# Patient Record
Sex: Male | Born: 1985
Health system: Southern US, Community
[De-identification: ages and names within clinical notes are randomized; demographics above are authoritative.]

## PROBLEM LIST (undated history)

## (undated) DIAGNOSIS — Z87442 Personal history of urinary calculi: Secondary | ICD-10-CM

## (undated) DIAGNOSIS — M1990 Unspecified osteoarthritis, unspecified site: Secondary | ICD-10-CM

## (undated) DIAGNOSIS — M84371S Stress fracture, right ankle, sequela: Secondary | ICD-10-CM

## (undated) DIAGNOSIS — S63091S Other subluxation of right wrist and hand, sequela: Secondary | ICD-10-CM

## (undated) DIAGNOSIS — C649 Malignant neoplasm of unspecified kidney, except renal pelvis: Secondary | ICD-10-CM

## (undated) DIAGNOSIS — I10 Essential (primary) hypertension: Secondary | ICD-10-CM

## (undated) DIAGNOSIS — T7840XA Allergy, unspecified, initial encounter: Secondary | ICD-10-CM

## (undated) HISTORY — DX: Allergy, unspecified, initial encounter: T78.40XA

## (undated) HISTORY — DX: Other subluxation of right wrist and hand, sequela: S63.091S

## (undated) HISTORY — DX: Stress fracture, right ankle, sequela: M84.371S

## (undated) HISTORY — DX: Essential (primary) hypertension: I10

## (undated) HISTORY — PX: WISDOM TOOTH EXTRACTION: SHX21

## (undated) HISTORY — DX: Malignant neoplasm of unspecified kidney, except renal pelvis: C64.9

## (undated) HISTORY — DX: Unspecified osteoarthritis, unspecified site: M19.90

---
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
## 2016-12-11 ENCOUNTER — Ambulatory Visit (INDEPENDENT_AMBULATORY_CARE_PROVIDER_SITE_OTHER): Payer: Commercial Managed Care - PPO | Admitting: Orthopaedic Surgery

## 2016-12-11 ENCOUNTER — Encounter (INDEPENDENT_AMBULATORY_CARE_PROVIDER_SITE_OTHER): Payer: Self-pay | Admitting: Orthopaedic Surgery

## 2016-12-11 ENCOUNTER — Ambulatory Visit (INDEPENDENT_AMBULATORY_CARE_PROVIDER_SITE_OTHER): Payer: Commercial Managed Care - PPO

## 2016-12-11 DIAGNOSIS — S63094A Other dislocation of right wrist and hand, initial encounter: Secondary | ICD-10-CM

## 2016-12-11 DIAGNOSIS — IMO0001 Reserved for inherently not codable concepts without codable children: Secondary | ICD-10-CM

## 2016-12-11 NOTE — Progress Notes (Signed)
   Office Visit Note   Patient: Richard [name]           Date of Birth: [birth year]           MRN: [id number] Visit Date: 12/11/2016              Requested by: No referring provider defined for this encounter. PCP: No PCP Per Patient   Assessment & Plan: Visit Diagnoses:  1. ECU (extensor carpi ulnaris), subluxation/dislocation, right, initial encounter     Plan: At this point he has had significant conservative treatment I think he should try to resume tennis with a support brace or taping to his sister who is an Product/process development scientist will provide for him. He can start to relax his splinting at night as long as his symptoms don't worsen with playing tennis. Questions encouraged and answered. I will see him back as needed.  Follow-Up Instructions: Return if symptoms worsen or fail to improve.   Orders:  Orders Placed This Encounter  Procedures  . XR Wrist Complete Right   No orders of the defined types were placed in this encounter.     Procedures: No procedures performed   Clinical Data: No additional findings.   Subjective: Chief Complaint  Patient presents with  . Right Wrist - Pain    Patient is a 31 year old right-hand dominant gentleman who has had a subluxing ECU tendon with pain since October 31. He has been seen and treated conservatively in [place] but she is to follow-up here today. 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 He has worn a custom splint which keeps his elbow in 90 of flexion and wrist pronation at night for about 2 months. This has significantly helped. He takes over-the-counter medicines as needed. His pain mainly is recreated with ulnar deviation of the wrist. He does not have any pain at work.    Review of Systems Complete review of systems is negative except for history of present illness  Objective: Vital Signs: There were no vitals taken for this  visit.  Physical Exam Well-developed well-nourished no acute distress alert 3 however breathing normal judgments affect abdomen soft no lymphadenopathy Ortho Exam Exam of the right wrist shows a subluxing ECU tendon with ulnar deviation of the wrist. This does not seem to cause him significant pain. There is no crepitus with palpation. Specialty Comments:  No specialty comments available.  Imaging: Xr Wrist Complete Right  Result Date: 12/11/2016 No acute bony abnormalities    PMFS History: Patient Active Problem List   Diagnosis Date Noted  . ECU (extensor carpi ulnaris), subluxation/dislocation, right, initial encounter 12/11/2016   No past medical history on file.  No family history on file.  No past surgical history on file. Social History   Occupational History  . Not on file.   Social History Main Topics  . Smoking status: Never Smoker  . Smokeless tobacco: Never Used  . Alcohol use Not on file  . Drug use: Unknown  . Sexual activity: Not on file

## 2018-04-18 ENCOUNTER — Ambulatory Visit (INDEPENDENT_AMBULATORY_CARE_PROVIDER_SITE_OTHER): Payer: Self-pay | Admitting: Nurse Practitioner

## 2018-04-18 VITALS — BP 140/75 | HR 74 | Temp 98.5°F | Resp 16 | Wt 230.2 lb

## 2018-04-18 DIAGNOSIS — J069 Acute upper respiratory infection, unspecified: Secondary | ICD-10-CM

## 2018-04-18 MED ORDER — AZITHROMYCIN 250 MG PO TABS: ORAL_TABLET | ORAL | 0 refills | Status: AC

## 2018-04-18 MED ORDER — ALBUTEROL SULFATE HFA 108 (90 BASE) MCG/ACT IN AERS: 2.0000 | INHALATION_SPRAY | Freq: Four times a day (QID) | RESPIRATORY_TRACT | 0 refills | Status: DC | PRN

## 2018-04-18 MED ORDER — AZITHROMYCIN 250 MG PO TABS: ORAL_TABLET | ORAL | 0 refills | Status: DC

## 2018-04-18 MED ORDER — PSEUDOEPH-BROMPHEN-DM 30-2-10 MG/5ML PO SYRP: 5.0000 mL | ORAL_SOLUTION | Freq: Four times a day (QID) | ORAL | 0 refills | Status: AC | PRN

## 2018-04-18 MED ORDER — MONTELUKAST SODIUM 10 MG PO TABS: 10.0000 mg | ORAL_TABLET | Freq: Every day | ORAL | 1 refills | Status: DC

## 2018-04-18 MED ORDER — PSEUDOEPH-BROMPHEN-DM 30-2-10 MG/5ML PO SYRP: 5.0000 mL | ORAL_SOLUTION | Freq: Four times a day (QID) | ORAL | 0 refills | Status: DC | PRN

## 2018-04-18 MED FILL — AZITHROMYCIN 250 MG TABS: 250 | 5 days supply | Qty: 6 | Fill #0

## 2018-04-18 MED FILL — MONTELUKAST SOD 10 MG TAB: 10 | 30 days supply | Qty: 30 | Fill #0

## 2018-04-18 MED FILL — VENTOLIN HFA 90 MCG INHALER: 108 (90 BAS | 25 days supply | Qty: 18 | Fill #0

## 2018-04-18 MED FILL — BROMIPHENIR-PSEUDOEPHED-DM: 30-2-10 | 4 days supply | Qty: 90 | Fill #0

## 2018-04-18 NOTE — Progress Notes (Signed)
Subjective:  Richard [name] is a 32 y.o. male who presents for evaluation of URI like symptoms.  Symptoms include bilateral ear pressure/pain, cough described as nonproductive, chills, fever: fevers up to 100.1 degrees, nasal congestion, post nasal drip, sinus pressure and sore throat.  Onset of symptoms was 4 days ago, and has been gradually worsening since that time.  Treatment to date:  antihistamines and ibuprofen.  High risk factors for influenza complications:  none.  The following portions of the patient's history were reviewed and updated as appropriate:  allergies, current medications and past medical history.  Constitutional: positive for chills, fatigue, fevers and sweats, negative for anorexia and night sweats Eyes: negative Ears, nose, mouth, throat, and face: positive for nasal congestion, sore throat and bilateral ear fullness/pressure, negative for ear drainage, earaches and hoarseness Respiratory: positive for cough and wheezing, negative for asthma, chronic bronchitis, dyspnea on exertion, sputum and stridor Cardiovascular: negative Gastrointestinal: negative Neurological: negative Allergic/Immunologic: positive for hay fever Objective:  BP 140/75 (BP Location: Right Arm, Patient Position: Sitting, Cuff Size: Normal)   Pulse 74   Temp 98.5 F (36.9 C) (Oral)   Resp 16   Wt 230 lb 3.2 oz (104.4 kg)   SpO2 97%  General appearance: alert, cooperative, fatigued and mildly diaphoretic Head: Normocephalic, without obvious abnormality, atraumatic Eyes: conjunctivae/corneas clear. PERRL, EOM's intact. Fundi benign. Ears: normal TM's and external ear canals both ears Nose: no discharge, turbinates swollen, inflamed, no sinus tenderness Throat: lips, mucosa, and tongue normal; teeth and gums normal Lungs: clear to auscultation bilaterally Heart: regular rate and rhythm, S1, S2 normal, no murmur, click, rub or gallop Abdomen: soft, non-tender; bowel sounds normal; no masses,  no  organomegaly Pulses: 2+ and symmetric Skin: Skin color, texture, turgor normal. No rashes or lesions Lymph nodes: cervical and submandibular nodes normal Neurologic: Grossly normal    Assessment:  Acute Upper Respiratory Infection and Cough    Plan:  Discussed diagnosis and treatment of URI. Educational material distributed and questions answered. Suggested symptomatic OTC remedies. Supportive care with appropriate antipyretics and fluids. Nasal saline spray for congestion. Zithromax, Bromfed, Albuterol (if needed), Singulair per orders. Nasal steroids per orders. Follow up as needed.  Meds ordered this encounter  Medications  . DISCONTD: azithromycin (ZITHROMAX) 250 MG tablet    Sig: Take as directed.    Dispense:  6 tablet    Refill:  0    Order Specific Question:   Supervising Provider    Answer:   Ricard Dillon [[id number]]  . DISCONTD: brompheniramine-pseudoephedrine-DM 30-2-10 MG/5ML syrup    Sig: Take 5 mLs by mouth 4 (four) times daily as needed for up to 7 days.    Dispense:  150 mL    Refill:  0    Order Specific Question:   Supervising Provider    Answer:   Ricard Dillon [[id number]]  . albuterol (PROVENTIL HFA;VENTOLIN HFA) 108 (90 Base) MCG/ACT inhaler    Sig: Inhale 2 puffs into the lungs every 6 (six) hours as needed for up to 10 days for wheezing or shortness of breath.    Dispense:  1 Inhaler    Refill:  0    Order Specific Question:   Supervising Provider    Answer:   Ricard Dillon [[id number]]  . DISCONTD: montelukast (SINGULAIR) 10 MG tablet    Sig: Take 1 tablet (10 mg total) by mouth at bedtime.    Dispense:  30 tablet    Refill:  1  Order Specific Question:   Supervising Provider    Answer:   Ricard Dillon [[id number]]  . azithromycin (ZITHROMAX) 250 MG tablet    Sig: Take as directed.    Dispense:  6 tablet    Refill:  0    Order Specific Question:   Supervising Provider    Answer:   Ricard Dillon [[id number]]  . brompheniramine-pseudoephedrine-DM 30-2-10 MG/5ML  syrup    Sig: Take 5 mLs by mouth 4 (four) times daily as needed for up to 7 days.    Dispense:  150 mL    Refill:  0    Order Specific Question:   Supervising Provider    Answer:   Ricard Dillon [[id number]]  . montelukast (SINGULAIR) 10 MG tablet    Sig: Take 1 tablet (10 mg total) by mouth at bedtime.    Dispense:  30 tablet    Refill:  1    Order Specific Question:   Supervising Provider    Answer:   Ricard Dillon [phone]

## 2018-04-18 NOTE — Patient Instructions (Signed)
Upper Respiratory Infection, Adult Most upper respiratory infections (URIs) are a viral infection of the air passages leading to the lungs. A URI affects the nose, throat, and upper air passages. The most common type of URI is nasopharyngitis and is typically referred to as "the common cold." URIs run their course and usually go away on their own. Most of the time, a URI does not require medical attention, but sometimes a bacterial infection in the upper airways can follow a viral infection. This is called a secondary infection. Sinus and middle ear infections are common types of secondary upper respiratory infections. Bacterial pneumonia can also complicate a URI. A URI can worsen asthma and chronic obstructive pulmonary disease (COPD). Sometimes, these complications can require emergency medical care and may be life threatening. What are the causes? Almost all URIs are caused by viruses. A virus is a type of germ and can spread from one person to another. What increases the risk? You may be at risk for a URI if:  You smoke.  You have chronic heart or lung disease.  You have a weakened defense (immune) system.  You are very young or very old.  You have nasal allergies or asthma.  You work in crowded or poorly ventilated areas.  You work in health care facilities or schools.  What are the signs or symptoms? Symptoms typically develop 2-3 days after you come in contact with a cold virus. Most viral URIs last 7-10 days. However, viral URIs from the influenza virus (flu virus) can last 14-18 days and are typically more severe. Symptoms may include:  Runny or stuffy (congested) nose.  Sneezing.  Cough.  Sore throat.  Headache.  Fatigue.  Fever.  Loss of appetite.  Pain in your forehead, behind your eyes, and over your cheekbones (sinus pain).  Muscle aches.  How is this diagnosed? Your health care provider may diagnose a URI by:  Physical exam.  Tests to check that your  symptoms are not due to another condition such as: ? Strep throat. ? Sinusitis. ? Pneumonia. ? Asthma.  How is this treated? A URI goes away on its own with time. It cannot be cured with medicines, but medicines may be prescribed or recommended to relieve symptoms. Medicines may help:  Reduce your fever.  Reduce your cough.  Relieve nasal congestion.  Follow these instructions at home:  Take medicines only as directed by your health care provider.  Gargle warm saltwater or take cough drops to comfort your throat as directed by your health care provider.  Use a warm mist humidifier or inhale steam from a shower to increase air moisture. This may make it easier to breathe.  Drink enough fluid to keep your urine clear or pale yellow.  Eat soups and other clear broths and maintain good nutrition.  Rest as needed.  Return to work when your temperature has returned to normal or as your health care provider advises. You may need to stay home longer to avoid infecting others. You can also use a face mask and careful hand washing to prevent spread of the virus.  Increase the usage of your inhaler if you have asthma.  Do not use any tobacco products, including cigarettes, chewing tobacco, or electronic cigarettes. If you need help quitting, ask your health care provider. How is this prevented? The best way to protect yourself from getting a cold is to practice good hygiene.  Avoid oral or hand contact with people with cold symptoms.  Wash your   hands often if contact occurs.  There is no clear evidence that vitamin C, vitamin E, echinacea, or exercise reduces the chance of developing a cold. However, it is always recommended to get plenty of rest, exercise, and practice good nutrition. Contact a health care provider if:  You are getting worse rather than better.  Your symptoms are not controlled by medicine.  You have chills.  You have worsening shortness of breath.  You have  brown or red mucus.  You have yellow or brown nasal discharge.  You have pain in your face, especially when you bend forward.  You have a fever.  You have swollen neck glands.  You have pain while swallowing.  You have white areas in the back of your throat. Get help right away if:  You have severe or persistent: ? Headache. ? Ear pain. ? Sinus pain. ? Chest pain.  You have chronic lung disease and any of the following: ? Wheezing. ? Prolonged cough. ? Coughing up blood. ? A change in your usual mucus.  You have a stiff neck.  You have changes in your: ? Vision. ? Hearing. ? Thinking. ? Mood. This information is not intended to replace advice given to you by your health care provider. Make sure you discuss any questions you have with your health care provider. Document Released: 05/19/2001 Document Revised: 07/26/2016 Document Reviewed: 02/28/2014 Elsevier Interactive Patient Education  2018 [employer]  Cough, Adult Coughing is a reflex that clears your throat and your airways. Coughing helps to heal and protect your lungs. It is normal to cough occasionally, but a cough that happens with other symptoms or lasts a long time may be a sign of a condition that needs treatment. A cough may last only 2-3 weeks (acute), or it may last longer than 8 weeks (chronic). What are the causes? Coughing is commonly caused by:  Breathing in substances that irritate your lungs.  A viral or bacterial respiratory infection.  Allergies.  Asthma.  Postnasal drip.  Smoking.  Acid backing up from the stomach into the esophagus (gastroesophageal reflux).  Certain medicines.  Chronic lung problems, including COPD (or rarely, lung cancer).  Other medical conditions such as heart failure.  Follow these instructions at home: Pay attention to any changes in your symptoms. Take these actions to help with your discomfort:  Take medicines only as told by your health care  provider. ? If you were prescribed an antibiotic medicine, take it as told by your health care provider. Do not stop taking the antibiotic even if you start to feel better. ? Talk with your health care provider before you take a cough suppressant medicine.  Drink enough fluid to keep your urine clear or pale yellow.  If the air is dry, use a cold steam vaporizer or humidifier in your bedroom or your home to help loosen secretions.  Avoid anything that causes you to cough at work or at home.  If your cough is worse at night, try sleeping in a semi-upright position.  Avoid cigarette smoke. If you smoke, quit smoking. If you need help quitting, ask your health care provider.  Avoid caffeine.  Avoid alcohol.  Rest as needed.  Contact a health care provider if:  You have new symptoms.  You cough up pus.  Your cough does not get better after 2-3 weeks, or your cough gets worse.  You cannot control your cough with suppressant medicines and you are losing sleep.  You develop pain that is   getting worse or pain that is not controlled with pain medicines.  You have a fever.  You have unexplained weight loss.  You have night sweats. Get help right away if:  You cough up blood.  You have difficulty breathing.  Your heartbeat is very fast. This information is not intended to replace advice given to you by your health care provider. Make sure you discuss any questions you have with your health care provider. Document Released: 05/22/2011 Document Revised: 04/30/2016 Document Reviewed: 01/30/2015 Elsevier Interactive Patient Education  2018 [employer] Inc.  

## 2018-04-20 ENCOUNTER — Telehealth: Payer: Self-pay

## 2018-04-20 NOTE — Telephone Encounter (Signed)
Patient states he is doing better and he is following the treatment prescribed by the provider.

## 2018-06-24 DIAGNOSIS — H5203 Hypermetropia, bilateral: Secondary | ICD-10-CM | POA: Diagnosis not present

## 2018-06-24 DIAGNOSIS — H52223 Regular astigmatism, bilateral: Secondary | ICD-10-CM | POA: Diagnosis not present

## 2018-09-07 ENCOUNTER — Ambulatory Visit: Payer: Self-pay | Admitting: Family Medicine

## 2018-09-15 ENCOUNTER — Encounter: Payer: Self-pay | Admitting: Family Medicine

## 2018-09-15 ENCOUNTER — Ambulatory Visit: Payer: 59 | Admitting: Family Medicine

## 2018-09-15 VITALS — BP 130/60 | HR 66 | Temp 97.8°F | Ht 70.5 in | Wt 236.2 lb

## 2018-09-15 DIAGNOSIS — D229 Melanocytic nevi, unspecified: Secondary | ICD-10-CM | POA: Diagnosis not present

## 2018-09-15 DIAGNOSIS — Z23 Encounter for immunization: Secondary | ICD-10-CM

## 2018-09-15 DIAGNOSIS — Z0000 Encounter for general adult medical examination without abnormal findings: Secondary | ICD-10-CM | POA: Diagnosis not present

## 2018-09-15 DIAGNOSIS — Z13220 Encounter for screening for lipoid disorders: Secondary | ICD-10-CM

## 2018-09-15 DIAGNOSIS — Z131 Encounter for screening for diabetes mellitus: Secondary | ICD-10-CM | POA: Diagnosis not present

## 2018-09-15 LAB — LIPID PANEL
CHOL/HDL RATIO: 4
Cholesterol: 188 mg/dL (ref 0–200)
HDL: 44.8 mg/dL (ref >39.00)
LDL CALC: 124 mg/dL — AB (ref 0–99)
NonHDL: 143.06
TRIGLYCERIDES: 95 mg/dL (ref 0.0–149.0)
VLDL: 19 mg/dL (ref 0.0–40.0)

## 2018-09-15 LAB — BASIC METABOLIC PANEL
BUN: 16 mg/dL (ref 6–23)
CHLORIDE: 101 meq/L (ref 96–112)
CO2: 33 mEq/L — ABNORMAL HIGH (ref 19–32)
CREATININE: 0.99 mg/dL (ref 0.40–1.50)
Calcium: 9.4 mg/dL (ref 8.4–10.5)
GFR: 92.2 mL/min (ref >60.00)
Glucose, Bld: 87 mg/dL (ref 70–99)
Potassium: 4.3 mEq/L (ref 3.5–5.1)
Sodium: 139 mEq/L (ref 135–145)

## 2018-09-15 NOTE — Addendum Note (Signed)
Addended by: Rene Kocher on: 09/15/2018 10:24 AM   Modules accepted: Orders

## 2018-09-15 NOTE — Progress Notes (Signed)
Richard [name] DOB: August 01, 1986 Encounter date: 09/15/2018  This is a 32 y.o. male who presents to establish care. Chief Complaint  Patient presents with  . New Patient (Initial Visit)    flu shot, fasting     History of present illness: Hasn't been following with doctor for some time.   Allergies: seasonal - taking flonase and using daily. In past did antihistamines, but gets good relief with flonase alone. Used to get a lot of sinus infections; but has done better with this.   Tries to eat healthy, doing spin class; plays tennis. Feels that this has helped bp which he has seen elevated in the past. Has seen it as high as 947 systolic but this was with excessive coffee/stress. Weight has been pretty stable in recent years.   Past Medical History:  Diagnosis Date  . Stress fracture, right ankle, sequela   . Subluxation of extensor carpi ulnaris tendon, right, sequela    Past Surgical History:  Procedure Laterality Date  . TONSILLECTOMY AND ADENOIDECTOMY  1996  . WISDOM TOOTH EXTRACTION     No Known Allergies Current Meds  Medication Sig  . fluticasone (FLONASE) 50 MCG/ACT nasal spray Place into both nostrils daily.  Marland Kitchen ibuprofen (ADVIL,MOTRIN) 600 MG tablet ibuprofen 600 mg tablet   Social History   Tobacco Use  . Smoking status: Never Smoker  . Smokeless tobacco: Never Used  Substance Use Topics  . Alcohol use: Yes    Comment: social on weekend   Family History  Problem Relation Age of Onset  . High blood pressure Mother   . High Cholesterol Mother   . Arthritis-Osteo Father   . Hearing loss Father   . High blood pressure Father   . Asthma Sister   . Hearing loss Maternal Grandmother   . Heart disease Maternal Grandmother   . High blood pressure Maternal Grandmother   . High Cholesterol Maternal Grandmother   . Stroke Maternal Grandmother 80       TIAs  . Heart attack Maternal Grandfather 55  . Cancer Paternal Grandmother        ?lung; smoker  . Hearing  loss Paternal Grandfather   . Bladder Cancer Paternal Uncle   . Bladder Cancer Paternal Uncle     Review of Systems  Constitutional: Negative for activity change, appetite change, chills, fatigue, fever and unexpected weight change.  HENT: Negative for congestion, ear pain, hearing loss, sinus pressure, sinus pain, sore throat and trouble swallowing.   Eyes: Negative for pain and visual disturbance.  Respiratory: Negative for cough, chest tightness, shortness of breath and wheezing.   Cardiovascular: Negative for chest pain, palpitations and leg swelling.  Gastrointestinal: Negative for abdominal distention, abdominal pain, blood in stool, constipation, diarrhea, nausea and vomiting.  Genitourinary: Negative for decreased urine volume, difficulty urinating, dysuria, penile pain and testicular pain.  Musculoskeletal: Negative for arthralgias, back pain and joint swelling.  Skin: Negative for rash.       Multiple skin moles; none that he is aware of changing, but there is one that he has noted inner upper left thigh. Not sure how long it has been there or if changing.   Neurological: Negative for dizziness, weakness, numbness and headaches.  Hematological: Negative for adenopathy. Does not bruise/bleed easily.  Psychiatric/Behavioral: Negative for agitation, sleep disturbance and suicidal ideas. The patient is not nervous/anxious.     Objective:  BP 130/60 (BP Location: Left Arm, Patient Position: Sitting, Cuff Size: Normal)   Pulse 66  Temp 97.8 F (36.6 C) (Oral)   Ht 5' 10.5" (1.791 m)   Wt 236 lb 3.2 oz (107.1 kg)   SpO2 96%   BMI 33.41 kg/m   Weight: 236 lb 3.2 oz (107.1 kg)   BP Readings from Last 3 Encounters:  09/15/18 130/60  04/18/18 140/75   Wt Readings from Last 3 Encounters:  09/15/18 236 lb 3.2 oz (107.1 kg)  04/18/18 230 lb 3.2 oz (104.4 kg)    Physical Exam  Constitutional: He is oriented to person, place, and time. He appears well-developed and  well-nourished. No distress.  HENT:  Head: Normocephalic and atraumatic.  Right Ear: External ear normal.  Left Ear: External ear normal.  Nose: Nose normal.  Mouth/Throat: Oropharynx is clear and moist. No oropharyngeal exudate.    Eyes: Pupils are equal, round, and reactive to light. Conjunctivae are normal.  Neck: Neck supple. No thyromegaly present.  Cardiovascular: Normal rate, regular rhythm, normal heart sounds and intact distal pulses. Exam reveals no gallop and no friction rub.  No murmur heard. Pulmonary/Chest: Effort normal and breath sounds normal. No stridor. No respiratory distress. He has no wheezes. He has no rales.  Abdominal: Soft. Bowel sounds are normal.  Genitourinary: Testes normal and penis normal. Right testis shows no mass, no swelling and no tenderness. Right testis is descended. Left testis shows no mass, no swelling and no tenderness. Left testis is descended. Circumcised.  Musculoskeletal: Normal range of motion.  Neurological: He is alert and oriented to person, place, and time.  Skin: Skin is warm and dry.  There is approx 0.5cm dark mole left upper inner thigh  Psychiatric: He has a normal mood and affect. His behavior is normal. Judgment and thought content normal.    Assessment/Plan:  1. Preventative health care Keep up with healthy eating/exercise.  2. Numerous skin moles Sending to derm for monitoring and for further eval of left upper thigh mole. - Ambulatory referral to Dermatology  3. Lipid screening - Lipid panel; Future  4. Screening for diabetes mellitus - Basic metabolic panel; Future   Return in about 1 year (around 09/16/2019).  Micheline Rough, MD

## 2018-09-15 NOTE — Patient Instructions (Addendum)
Keep up with healthy eating and regular exercise.  Make appointment with dentist; have them recheck "bump" in back of throat.  Why is Exercise Important? If I told you I had a single pill that would help you decrease stress by improving anxiety, decreasing depression, help you achieve a healthy weight, give you more energy, make you more productive, help you focus, decrease your risk of dementia/heart attack/stroke/falls, improve your bone health, and more would you be interested? These are just some of the benefits that exercise brings to you. IT IS WORTH carving out some time every day to fit in exercise. It will help in every aspect of your health. Even if you have injuries that prevent you from participating in a type of exercise you used to do; there is always something that you can do to keep exercise a part of your life. If improving your health is important, make exercise your priority. It is worth the time! If you have questions about the type of exercise that is right for you, please talk with me about this!     Exercising to Stay Healthy  Exercising regularly is important. It has many health benefits, such as:  Improving your overall fitness, flexibility, and endurance.  Increasing your bone density.  Helping with weight control.  Decreasing your body fat.  Increasing your muscle strength.  Reducing stress and tension.  Improving your overall health.   In order to become healthy and stay healthy, it is recommended that you do moderate-intensity and vigorous-intensity exercise. You can tell that you are exercising at a moderate intensity if you have a higher heart rate and faster breathing, but you are still able to hold a conversation. You can tell that you are exercising at a vigorous intensity if you are breathing much harder and faster and cannot hold a conversation while exercising. How often should I exercise? Choose an activity that you enjoy and set realistic goals. Your  health care provider can help you to make an activity plan that works for you. Exercise regularly as directed by your health care provider. This may include:  Doing resistance training twice each week, such as: ? Push-ups. ? Sit-ups. ? Lifting weights. ? Using resistance bands.  Doing a given intensity of exercise for a given amount of time. Choose from these options: ? 150 minutes of moderate-intensity exercise every week. ? 75 minutes of vigorous-intensity exercise every week. ? A mix of moderate-intensity and vigorous-intensity exercise every week.   Children, pregnant women, people who are out of shape, people who are overweight, and older adults may need to consult a health care provider for individual recommendations. If you have any sort of medical condition, be sure to consult your health care provider before starting a new exercise program. What are some exercise ideas? Some moderate-intensity exercise ideas include:  Walking at a rate of 1 mile in 15 minutes.  Biking.  Hiking.  Golfing.  Dancing.   Some vigorous-intensity exercise ideas include:  Walking at a rate of at least 4.5 miles per hour.  Jogging or running at a rate of 5 miles per hour.  Biking at a rate of at least 10 miles per hour.  Lap swimming.  Roller-skating or in-line skating.  Cross-country skiing.  Vigorous competitive sports, such as football, basketball, and soccer.  Jumping rope.  Aerobic dancing.   What are some everyday activities that can help me to get exercise?  [place] work, such as: ? Pushing a Conservation officer, nature. ? Raking  and bagging leaves.  Washing and waxing your car.  Pushing a stroller.  Shoveling snow.  Gardening.  Washing windows or floors. How can I be more active in my day-to-day activities?  Use the stairs instead of the elevator.  Take a walk during your lunch break.  If you drive, park your car farther away from work or school.  If you take public  transportation, get off one stop early and walk the rest of the way.  Make all of your phone calls while standing up and walking around.  Get up, stretch, and walk around every 30 minutes throughout the day. What guidelines should I follow while exercising?  Do not exercise so much that you hurt yourself, feel dizzy, or get very short of breath.  Consult your health care provider before starting a new exercise program.  Wear comfortable clothes and shoes with good support.  Drink plenty of water while you exercise to prevent dehydration or heat stroke. Body water is lost during exercise and must be replaced.  Work out until you breathe faster and your heart beats faster. This information is not intended to replace advice given to you by your health care provider. Make sure you discuss any questions you have with your health care provider.   DASH Eating Plan DASH stands for "Dietary Approaches to Stop Hypertension." The DASH eating plan is a healthy eating plan that has been shown to reduce high blood pressure (hypertension). It may also reduce your risk for type 2 diabetes, heart disease, and stroke. The DASH eating plan may also help with weight loss. What are tips for following this plan? General guidelines  Avoid eating more than 2,300 mg (milligrams) of salt (sodium) a day. If you have hypertension, you may need to reduce your sodium intake to 1,500 mg a day.  Limit alcohol intake to no more than 1 drink a day for nonpregnant women and 2 drinks a day for men. One drink equals 12 oz of beer, 5 oz of wine, or 1 oz of hard liquor.  Work with your health care provider to maintain a healthy body weight or to lose weight. Ask what an ideal weight is for you.  Get at least 30 minutes of exercise that causes your heart to beat faster (aerobic exercise) most days of the week. Activities may include walking, swimming, or biking.  Work with your health care provider or diet and nutrition  specialist (dietitian) to adjust your eating plan to your individual calorie needs. Reading food labels  Check food labels for the amount of sodium per serving. Choose foods with less than 5 percent of the Daily Value of sodium. Generally, foods with less than 300 mg of sodium per serving fit into this eating plan.  To find whole grains, look for the word "whole" as the first word in the ingredient list. Shopping  Buy products labeled as "low-sodium" or "no salt added."  Buy fresh foods. Avoid canned foods and premade or frozen meals. Cooking  Avoid adding salt when cooking. Use salt-free seasonings or herbs instead of table salt or sea salt. Check with your health care provider or pharmacist before using salt substitutes.  Do not fry foods. Cook foods using healthy methods such as baking, boiling, grilling, and broiling instead.  Cook with heart-healthy oils, such as olive, canola, soybean, or sunflower oil. Meal planning   Eat a balanced diet that includes: ? 5 or more servings of fruits and vegetables each day. At each meal,  try to fill half of your plate with fruits and vegetables. ? Up to 6-8 servings of whole grains each day. ? Less than 6 oz of lean meat, poultry, or fish each day. A 3-oz serving of meat is about the same size as a deck of cards. One egg equals 1 oz. ? 2 servings of low-fat dairy each day. ? A serving of nuts, seeds, or beans 5 times each week. ? Heart-healthy fats. Healthy fats called Omega-3 fatty acids are found in foods such as flaxseeds and coldwater fish, like sardines, salmon, and mackerel.  Limit how much you eat of the following: ? Canned or prepackaged foods. ? Food that is high in trans fat, such as fried foods. ? Food that is high in saturated fat, such as fatty meat. ? Sweets, desserts, sugary drinks, and other foods with added sugar. ? Full-fat dairy products.  Do not salt foods before eating.  Try to eat at least 2 vegetarian meals each  week.  Eat more home-cooked food and less restaurant, buffet, and fast food.  When eating at a restaurant, ask that your food be prepared with less salt or no salt, if possible. What foods are recommended? The items listed may not be a complete list. Talk with your dietitian about what dietary choices are best for you. Grains Whole-grain or whole-wheat bread. Whole-grain or whole-wheat pasta. Brown rice. [place]. Bulgur. Whole-grain and low-sodium cereals. Pita bread. Low-fat, low-sodium crackers. Whole-wheat flour tortillas. Vegetables Fresh or frozen vegetables (raw, steamed, roasted, or grilled). Low-sodium or reduced-sodium tomato and vegetable juice. Low-sodium or reduced-sodium tomato sauce and tomato paste. Low-sodium or reduced-sodium canned vegetables. Fruits All fresh, dried, or frozen fruit. Canned fruit in natural juice (without added sugar). Meat and other protein foods Skinless chicken or [place]. Ground chicken or [place]. Pork with fat trimmed off. Fish and seafood. Egg whites. Dried beans, peas, or lentils. Unsalted nuts, nut butters, and seeds. Unsalted canned beans. Lean cuts of beef with fat trimmed off. Low-sodium, lean deli meat. Dairy Low-fat (1%) or fat-free (skim) milk. Fat-free, low-fat, or reduced-fat cheeses. Nonfat, low-sodium ricotta or cottage cheese. Low-fat or nonfat yogurt. Low-fat, low-sodium cheese. Fats and oils Soft margarine without trans fats. Vegetable oil. Low-fat, reduced-fat, or light mayonnaise and salad dressings (reduced-sodium). Canola, safflower, olive, soybean, and sunflower oils. Avocado. Seasoning and other foods Herbs. Spices. Seasoning mixes without salt. Unsalted popcorn and pretzels. Fat-free sweets. What foods are not recommended? The items listed may not be a complete list. Talk with your dietitian about what dietary choices are best for you. Grains Baked goods made with fat, such as croissants, muffins, or some breads. Dry pasta  or rice meal packs. Vegetables Creamed or fried vegetables. Vegetables in a cheese sauce. Regular canned vegetables (not low-sodium or reduced-sodium). Regular canned tomato sauce and paste (not low-sodium or reduced-sodium). Regular tomato and vegetable juice (not low-sodium or reduced-sodium). Angie Fava. Olives. Fruits Canned fruit in a light or heavy syrup. Fried fruit. Fruit in cream or butter sauce. Meat and other protein foods Fatty cuts of meat. Ribs. Fried meat. Berniece Salines. Sausage. Bologna and other processed lunch meats. Salami. Fatback. Hotdogs. Bratwurst. Salted nuts and seeds. Canned beans with added salt. Canned or smoked fish. Whole eggs or egg yolks. Chicken or [place] with skin. Dairy Whole or 2% milk, cream, and half-and-half. Whole or full-fat cream cheese. Whole-fat or sweetened yogurt. Full-fat cheese. Nondairy creamers. Whipped toppings. Processed cheese and cheese spreads. Fats and oils Butter. Stick margarine. Lard. Shortening. Ghee.  Bacon fat. Tropical oils, such as coconut, palm kernel, or palm oil. Seasoning and other foods Salted popcorn and pretzels. Onion salt, garlic salt, seasoned salt, table salt, and sea salt. Worcestershire sauce. Tartar sauce. Barbecue sauce. Teriyaki sauce. Soy sauce, including reduced-sodium. Steak sauce. Canned and packaged gravies. Fish sauce. Oyster sauce. Cocktail sauce. Horseradish that you find on the shelf. Ketchup. Mustard. Meat flavorings and tenderizers. Bouillon cubes. Hot sauce and Tabasco sauce. Premade or packaged marinades. Premade or packaged taco seasonings. Relishes. Regular salad dressings. Where to find more information:  National Heart, Lung, and [place]: https://wilson-eaton.com/  American Heart Association: www.heart.org Summary  The DASH eating plan is a healthy eating plan that has been shown to reduce high blood pressure (hypertension). It may also reduce your risk for type 2 diabetes, heart disease, and stroke.  With the  DASH eating plan, you should limit salt (sodium) intake to 2,300 mg a day. If you have hypertension, you may need to reduce your sodium intake to 1,500 mg a day.  When on the DASH eating plan, aim to eat more fresh fruits and vegetables, whole grains, lean proteins, low-fat dairy, and heart-healthy fats.  Work with your health care provider or diet and nutrition specialist (dietitian) to adjust your eating plan to your individual calorie needs. This information is not intended to replace advice given to you by your health care provider. Make sure you discuss any questions you have with your health care provider. Document Released: 11/12/2011 Document Revised: 11/16/2016 Document Reviewed: 11/16/2016 Elsevier Interactive Patient Education  [employer].

## 2018-11-14 ENCOUNTER — Ambulatory Visit (INDEPENDENT_AMBULATORY_CARE_PROVIDER_SITE_OTHER): Payer: Self-pay | Admitting: Family Medicine

## 2018-11-14 VITALS — BP 122/80 | HR 71 | Temp 98.4°F | Wt 228.4 lb

## 2018-11-14 DIAGNOSIS — H1011 Acute atopic conjunctivitis, right eye: Secondary | ICD-10-CM

## 2018-11-14 MED ORDER — OLOPATADINE HCL 0.1 % OP SOLN: 1.0000 [drp] | Freq: Two times a day (BID) | OPHTHALMIC | 0 refills | Status: DC

## 2018-11-14 MED FILL — OLOPATADINE HCL 0.1% EYE DR: 0.1 | 25 days supply | Qty: 5 | Fill #0

## 2018-11-14 NOTE — Progress Notes (Signed)
Richard [name] is a 32 y.o. male who presents today with concerns of eye pain and redness x 2 days. He reports a new pet dog and is now walking outside frequently and the dog is inside and outside, he does confirm a history of seasonal allergies, and fam hx of glaucoma/macular degeneration in grandparents. His most recent eye exam this year and he states that his eye doctor is not concerned for any of the reported hereditary conditions at this time- Today his eye exam screening (using eye chart) is WNL.  Review of Systems  Constitutional: Negative for chills, fever and malaise/fatigue.  HENT: Negative for congestion, ear discharge, ear pain, sinus pain and sore throat.   Eyes: Positive for pain and redness. Negative for blurred vision, double vision, photophobia and discharge.  Respiratory: Negative for cough, sputum production and shortness of breath.   Cardiovascular: Negative.  Negative for chest pain.  Gastrointestinal: Negative for abdominal pain, diarrhea, nausea and vomiting.  Genitourinary: Negative for dysuria, frequency, hematuria and urgency.  Musculoskeletal: Negative for myalgias.  Skin: Negative.   Neurological: Negative for headaches.  Endo/Heme/Allergies: Negative.   Psychiatric/Behavioral: Negative.     O: Vitals:   11/14/18 0833  BP: 122/80  Pulse: 71  Temp: 98.4 F (36.9 C)  SpO2: 97%     Physical Exam  Constitutional: He is oriented to person, place, and time. Vital signs are normal. He appears well-developed and well-nourished. He is active.  Non-toxic appearance. He does not have a sickly appearance.  HENT:  Head: Normocephalic. Head is without raccoon's eyes, without Battle's sign, without contusion, without right periorbital erythema and without left periorbital erythema.  Right Ear: Hearing, tympanic membrane, external ear and ear canal normal.  Left Ear: Hearing, tympanic membrane, external ear and ear canal normal.  Nose: Nose normal.  Mouth/Throat:  Uvula is midline and oropharynx is clear and moist.  Eyes: Pupils are equal, round, and reactive to light. EOM and lids are normal. Right eye exhibits no chemosis, no discharge, no exudate and no hordeolum. No foreign body present in the right eye. Left eye exhibits no chemosis, no discharge, no exudate and no hordeolum. No foreign body present in the left eye. Right conjunctiva is injected. Right conjunctiva has no hemorrhage. Left conjunctiva is not injected. Left conjunctiva has no hemorrhage.    Mild sclera redness no edema- mild tenderness on palpation to right upper lid with eye closed. No vision changes on exam or abnormalities noted. Eye exam overall WNL  Neck: Normal range of motion. Neck supple.  Cardiovascular: Normal rate, regular rhythm, normal heart sounds and normal pulses.  Pulmonary/Chest: Effort normal and breath sounds normal.  Abdominal: Soft. Bowel sounds are normal.  Musculoskeletal: Normal range of motion.  Lymphadenopathy:       Head (right side): No submental and no submandibular adenopathy present.       Head (left side): No submental and no submandibular adenopathy present.    He has no cervical adenopathy.  Neurological: He is alert and oriented to person, place, and time. No cranial nerve deficit or sensory deficit.  Neuro exam WNL- sensation intact  Psychiatric: He has a normal mood and affect.  Vitals reviewed.  A: 1. Allergic conjunctivitis of right eye    P: Discussed exam findings, diagnosis etiology and medication use and indications reviewed with patient. Follow- Up and discharge instructions provided. No emergent/urgent issues found on exam.  Patient verbalized understanding of information provided and agrees with plan of care (POC), all  questions answered.  1. Allergic conjunctivitis of right eye - olopatadine (PATANOL) 0.1 % ophthalmic solution; Place 1 drop into both eyes 2 (two) times daily.  Follow up discussed Claritin daily for the next 2  weeks Monitor for redness, vision or crusting of eye changes Use eye drops as discussed F/U in 72 hours if symptoms do not improve

## 2018-11-14 NOTE — Patient Instructions (Addendum)
Allergic Conjunctivitis   Claritin daily for the next 2 weeks Monitor for redness, vision or crusting of eye changes Use eye drops as discussed F/U in 72 hours if symptoms do not improve  A clear membrane (conjunctiva) covers the white part of your eye and the inner surface of your eyelid. Allergic conjunctivitis happens when this membrane has inflammation. This is caused by allergies. Common causes of allergic reactions (allergens)include:  Outdoor allergens, such as: ? Pollen. ? Grass and weeds. ? Mold spores.  Indoor allergens, such as: ? Dust. ? Smoke. ? Mold. ? Pet dander. ? Animal hair.  This condition can make your eye red or pink. It can also make your eye feel itchy. This condition cannot be spread from one person to another person (is not contagious). Follow these instructions at home:  Try not to be around things that you are allergic to.  Take or apply over-the-counter and prescription medicines only as told by your doctor. These include any eye drops.  Place a cool, clean washcloth on your eye for 10-20 minutes. Do this 3-4 times a day.  Do not touch or rub your eyes.  Do not wear contact lenses until the inflammation is gone. Wear glasses instead.  Do not wear eye makeup until the inflammation is gone.  Keep all follow-up visits as told by your doctor. This is important. Contact a doctor if:  Your symptoms get worse.  Your symptoms do not get better with treatment.  You have mild eye pain.  You are sensitive to light,  You have spots or blisters on your eyes.  You have pus coming from your eye.  You have a fever. Get help right away if:  You have redness, swelling, or other symptoms in only one eye.  Your vision is blurry.  You have vision changes.  You have very bad eye pain. Summary  Allergic conjunctivitis is caused by allergies. It can make your eye red or pink, and it can make your eye feel itchy.  This condition cannot be spread  from one person to another person (is not contagious).  Try not to be around things that you are allergic to.  Take or apply over-the-counter and prescription medicines only as told by your doctor. These include any eye drops.  Contact your doctor if your symptoms get worse or they do not get better with treatment. This information is not intended to replace advice given to you by your health care provider. Make sure you discuss any questions you have with your health care provider. Document Released: 05/13/2010 Document Revised: 07/17/2016 Document Reviewed: 07/17/2016 Elsevier Interactive Patient Education  2017 [employer].

## 2018-11-16 ENCOUNTER — Telehealth: Payer: Self-pay

## 2018-11-16 NOTE — Telephone Encounter (Signed)
Patient states he is doing good and the redness is better after he started using the eye drops.

## 2018-11-20 ENCOUNTER — Ambulatory Visit (INDEPENDENT_AMBULATORY_CARE_PROVIDER_SITE_OTHER): Payer: Self-pay | Admitting: Family Medicine

## 2018-11-20 VITALS — BP 118/76 | HR 75 | Temp 98.3°F | Resp 20 | Wt 229.4 lb

## 2018-11-20 DIAGNOSIS — H5789 Other specified disorders of eye and adnexa: Secondary | ICD-10-CM

## 2018-11-20 DIAGNOSIS — H109 Unspecified conjunctivitis: Secondary | ICD-10-CM

## 2018-11-20 MED ORDER — CIPROFLOXACIN HCL 0.3 % OP SOLN: 1.0000 [drp] | OPHTHALMIC | 0 refills | Status: AC

## 2018-11-20 NOTE — Progress Notes (Signed)
Richard [name] is a 32 y.o. male who presents today with concerns of continued eye redness. Initial improvement with allergic eye drops with symptom return in the last 72 hours- total affected time of 10 days. Patient denies increase in severity but persistence of symptoms.  Previous visit history: 2 days eye redness- mild pain. He reports a new pet dog and is now walking outside frequently and the dog is inside and outside, he does confirm a history of seasonal allergies, and fam hx of glaucoma/macular degeneration in grandparents. His most recent eye exam this year and he states that his eye doctor is not concerned for any of the reported hereditary conditions at this time-   Review of Systems  Constitutional: Negative for chills, fever and malaise/fatigue.  HENT: Negative for congestion, ear discharge, ear pain, hearing loss, sinus pain, sore throat and tinnitus.   Eyes: Positive for redness. Negative for blurred vision, double vision, photophobia, pain and discharge.  Respiratory: Negative for cough, sputum production and shortness of breath.   Cardiovascular: Negative.  Negative for chest pain.  Gastrointestinal: Negative for abdominal pain, diarrhea, nausea and vomiting.  Genitourinary: Negative for dysuria, frequency, hematuria and urgency.  Musculoskeletal: Negative for myalgias.  Skin: Negative.   Neurological: Negative for headaches.  Endo/Heme/Allergies: Negative.   Psychiatric/Behavioral: Negative.     O: Vitals:   11/20/18 1257  BP: 118/76  Pulse: 75  Resp: 20  Temp: 98.3 F (36.8 C)  SpO2: 99%     Physical Exam Vitals signs reviewed.  Constitutional:      Appearance: He is well-developed. He is not toxic-appearing.  HENT:     Head: Normocephalic.     Right Ear: Hearing, tympanic membrane, ear canal and external ear normal.     Left Ear: Hearing, tympanic membrane, ear canal and external ear normal.     Nose: Nose normal.     Mouth/Throat:     Pharynx: Uvula  midline.  Eyes:     Conjunctiva/sclera:     Left eye: Left conjunctiva is injected. No chemosis, exudate or hemorrhage.     Comments: Increased redness to the sclera but not extending to the conjunctival loer lid upon exam. Patient reports mild pain but exam overall unchanged from initial- mild globe discomfort during palpation. EOM intact, gross neuro WNL. No evidence of crusting to eye or surrounding eye brow or nasolabial folds.  Neck:     Musculoskeletal: Normal range of motion and neck supple.  Cardiovascular:     Rate and Rhythm: Normal rate and regular rhythm.     Pulses: Normal pulses.     Heart sounds: Normal heart sounds.  Pulmonary:     Effort: Pulmonary effort is normal.     Breath sounds: Normal breath sounds.  Abdominal:     General: Bowel sounds are normal.     Palpations: Abdomen is soft.  Musculoskeletal: Normal range of motion.  Lymphadenopathy:     Head:     Right side of head: No submental or submandibular adenopathy.     Left side of head: No submental or submandibular adenopathy.     Cervical: No cervical adenopathy.  Neurological:     Mental Status: He is alert and oriented to person, place, and time.    A: 1. Eye redness   2. Conjunctivitis of right eye, unspecified conjunctivitis type    P:  Discussed exam findings, diagnosis etiology and medication use and indications reviewed with patient. Follow- Up and discharge instructions provided. No emergent/urgent  issues found on exam.  Patient verbalized understanding of information provided and agrees with plan of care (POC), all questions answered.  1. Eye redness Will cover for bacterial although no household contacts with simlar symptoms or risk- Plan discussed if symptoms not improved in 72 hours to schedule evaluation with eye doctor for this week. Patient agrees and reports a good relationship with local eye provider.  2. Conjunctivitis of right eye, unspecified conjunctivitis type - ciprofloxacin  (CILOXAN) 0.3 % ophthalmic solution; Place 1 drop into the right eye every 4 (four) hours while awake for 5 days.

## 2018-11-20 NOTE — Patient Instructions (Signed)

## 2018-12-26 NOTE — Telephone Encounter (Signed)
PHONECALL  

## 2019-06-26 ENCOUNTER — Other Ambulatory Visit: Payer: Self-pay

## 2019-06-26 DIAGNOSIS — Z20822 Contact with and (suspected) exposure to covid-19: Secondary | ICD-10-CM

## 2019-06-29 LAB — NOVEL CORONAVIRUS, NAA: SARS-CoV-2, NAA: NOT DETECTED

## 2019-08-09 ENCOUNTER — Encounter: Payer: Self-pay | Admitting: Family Medicine

## 2019-08-10 NOTE — Telephone Encounter (Signed)
Would suggest in office eval/treatment. We should be able to shave down some of callous and come up with treatment plan.   Not sure level of discomfort and urgency he feels for treatment. I believe Friday is full and it is a long weekend so you could check provider availability if wanting to be seen sooner and there are no openings on my schedule.

## 2019-08-11 ENCOUNTER — Encounter: Payer: Self-pay | Admitting: Family Medicine

## 2019-08-11 ENCOUNTER — Ambulatory Visit: Payer: 59 | Admitting: Family Medicine

## 2019-08-11 VITALS — BP 140/70 | HR 83 | Temp 98.5°F | Wt 219.2 lb

## 2019-08-11 DIAGNOSIS — B070 Plantar wart: Secondary | ICD-10-CM

## 2019-08-11 MED ORDER — PODOFILOX 0.5 % EX SOLN: Freq: Two times a day (BID) | CUTANEOUS | 2 refills | Status: DC

## 2019-08-11 MED FILL — PODOFILOX 0.5% TOPICAL SOLN: 0.5 | 15 days supply | Qty: 4 | Fill #0

## 2019-08-11 NOTE — Progress Notes (Signed)
   Subjective:    Patient ID: Richard [name], male    DOB: 07-13-1986, 33 y.o.   MRN: [id number]  HPI Here for c;lusters of spots on the soles of both feet that started about a month ago. They are somewhat uncomfortable to walk on.    Review of Systems  Constitutional: Negative.   Respiratory: Negative.   Cardiovascular: Negative.        Objective:   Physical Exam Constitutional:      Appearance: Normal appearance.  Cardiovascular:     Rate and Rhythm: Normal rate and regular rhythm.     Pulses: Normal pulses.     Heart sounds: Normal heart sounds.  Pulmonary:     Effort: Pulmonary effort is normal.     Breath sounds: Normal breath sounds.  Skin:    Comments: The sole of the left foot has a large cluster of warts over the ball of the foot. The sole of the right foot has a single wart  Neurological:     Mental Status: He is alert.           Assessment & Plan:  Plantar warts, treat with Podofilox solution bid. Recheck prn.  Alysia Penna, MD

## 2019-08-16 ENCOUNTER — Ambulatory Visit: Payer: 59 | Admitting: Family Medicine

## 2019-08-24 MED FILL — PODOFILOX 0.5% TOPICAL SOLN: 0.5 | 15 days supply | Qty: 4 | Fill #1

## 2019-09-15 ENCOUNTER — Telehealth: Payer: 59 | Admitting: Nurse Practitioner

## 2019-09-15 DIAGNOSIS — T63481A Toxic effect of venom of other arthropod, accidental (unintentional), initial encounter: Secondary | ICD-10-CM | POA: Diagnosis not present

## 2019-09-15 MED ORDER — PREDNISONE 20 MG PO TABS: 40.0000 mg | ORAL_TABLET | Freq: Every day | ORAL | 0 refills | Status: AC

## 2019-09-15 MED FILL — predniSONE 20 MG TABS: 20 | 5 days supply | Qty: 10 | Fill #0

## 2019-09-15 NOTE — Progress Notes (Signed)
E Visit for Insect Sting  Thank you for describing the insect sting for [place].  Here is how we plan to help!  A sting that we will treat with a short course of prednisone.  The 2 greatest risks from insect stings are allergic reaction, which can be fatal in some people and infection, which is more common and less serious.  Bees, wasps, yellow jackets, and hornets belong to a class of insects called Hymenoptera.  Most insect stings cause only minor discomfort.  Stings can happen anywhere on the body and can be painful.  Most stings are from honey bees or yellow jackets.  Fire ants can sting multiple times.  The sites of the stings are more likely to become infected.    I have sent in prednisone 40 mg by mouth daily for 5 days to the pharmacy you selected.  Please make sure that you selected a pharmacy that is open now.  What can be used to prevent Insect Stings?   Insect repellant with at least 20% DEET.    Wearing long pants and shirts with socks and shoes.    Wear dark or drab-colored clothes rather than bright colors.    Avoid using perfumes and hair sprays; these attract insects.  HOME CARE ADVICE:  1. Stinger removal:  The stinger looks like a tiny black dot in the sting.  Use a fingernail, credit card edge, or knife-edge to scrape it off.  Don't pull it out because it squeezes out more venom.  If the stinger is below the skin surface, leave it alone.  It will be shed with normal skin healing. 2. Use cold compresses to the area of the sting for 10-20 minutes.  You may repeat this as needed to relieve symptoms of pain and swelling. 3.  For pain relief, take acetominophen 650 mg 4-6 hours as needed or ibuprofen 400 mg every 6-8 hours as needed or naproxen 250-500 mg every 12 hours as needed. 4.  You can also use hydrocortisone cream 0.5% or 1% up to 4 times daily as needed for itching. 5.  If the sting becomes very itchy, take Benadryl 25-50 mg, follow directions on box. 6.   Wash the area 2-3 times daily with antibacterial soap and warm water. 7. Call your Doctor if:  Fever, a severe headache, or rash occur in the next 2 weeks.  Sting area begins to look infected.  Redness and swelling worsens after home treatment.  Your current symptoms become worse.    MAKE SURE YOU:   Understand these instructions.  Will watch your condition.  Will get help right away if you are not doing well or get worse.  Thank you for choosing an e-visit. Your e-visit answers were reviewed by a board certified advanced clinical practitioner to complete your personal care plan. Depending upon the condition, your plan could have included both over the counter or prescription medications. Please review your pharmacy choice. Be sure that the pharmacy you have chosen is open so that you can pick up your prescription now.  If there is a problem you may message your provider in [place] to have the prescription routed to another pharmacy. Your safety is important to [place]. If you have drug allergies check your prescription carefully.  For the next 24 hours, you can use MyChart to ask questions about today's visit, request a non-urgent call back, or ask for a work or school excuse from your e-visit provider. You will get an email in the  next two days asking about your experience. I hope that your e-visit has been valuable and will speed your recovery.  I have spent approximately 5 minutes reviewing and documenting in the patient's chart.

## 2019-09-26 DIAGNOSIS — H52223 Regular astigmatism, bilateral: Secondary | ICD-10-CM | POA: Diagnosis not present

## 2019-09-26 DIAGNOSIS — H5203 Hypermetropia, bilateral: Secondary | ICD-10-CM | POA: Diagnosis not present

## 2020-02-08 ENCOUNTER — Ambulatory Visit: Payer: 59 | Attending: Internal Medicine

## 2020-02-08 DIAGNOSIS — Z23 Encounter for immunization: Secondary | ICD-10-CM | POA: Insufficient documentation

## 2020-02-08 NOTE — Progress Notes (Signed)
   Covid-19 Vaccination Clinic  Name:  Richard [name]    MRN: [id number] DOB: 10-Jul-1986  02/08/2020  Richard [name] was observed post Covid-19 immunization for 15 minutes without incident. He was provided with Vaccine Information Sheet and instruction to access the V-Safe system.   Richard [name] was instructed to call 911 with any severe reactions post vaccine: Marland Kitchen Difficulty breathing  . Swelling of face and throat  . A fast heartbeat  . A bad rash all over body  . Dizziness and weakness   Immunizations Administered    Name Date Dose VIS Date Route   Pfizer COVID-19 Vaccine 02/08/2020 12:21 PM 0.3 mL 11/17/2019 Intramuscular   Manufacturer: [place]   Lot: [id number]   [name]: [id number]

## 2020-03-01 ENCOUNTER — Ambulatory Visit: Payer: 59 | Attending: Internal Medicine

## 2020-03-01 ENCOUNTER — Other Ambulatory Visit: Payer: Self-pay

## 2020-03-01 DIAGNOSIS — Z23 Encounter for immunization: Secondary | ICD-10-CM

## 2020-03-01 NOTE — Progress Notes (Signed)
   Covid-19 Vaccination Clinic  Name:  [name]    MRN: [id number] DOB: 1986/09/27  03/01/2020  Mr. FURNER was observed post Covid-19 immunization for 15 minutes without incident. He was provided with Vaccine Information Sheet and instruction to access the V-Safe system.   Mr. VANDUYNE was instructed to call 911 with any severe reactions post vaccine: Marland Kitchen Difficulty breathing  . Swelling of face and throat  . A fast heartbeat  . A bad rash all over body  . Dizziness and weakness   Immunizations Administered    Name Date Dose VIS Date Route   Pfizer COVID-19 Vaccine 03/01/2020 12:05 PM 0.3 mL 11/17/2019 Intramuscular   Manufacturer: [place]   Lot: [id number]   [place]: [id number]

## 2020-04-29 ENCOUNTER — Encounter: Payer: Self-pay | Admitting: Family Medicine

## 2020-04-29 NOTE — Telephone Encounter (Signed)
This would be good for appointment. Even virtual might be ok if willing to see Dr. Maudie Mercury if I don't have openings. If painful, increased swelling, fever, vision changes, the needs to be seen.  Sometimes there may be just an irritation in the eye or foreign body (even a speck of dust) that causes some irritation.  Avoid wearing any contacts if he has them.  Would be okay to continue with allergy eyedrops, but I would not use any other eyedrops over-the-counter besides those.  Hard to give any other advice without seeing the eye.

## 2020-04-29 NOTE — Telephone Encounter (Signed)
I called the pt and informed him of the message below.  Patient denies a fever, vision changes and stated the redness is not worse and asked if he could come in for an appt.  Appt scheduled with Tommi Rumps to arrive at 7:15am.  Message sent to [facility] also.

## 2020-04-30 ENCOUNTER — Ambulatory Visit (INDEPENDENT_AMBULATORY_CARE_PROVIDER_SITE_OTHER): Payer: 59 | Admitting: Adult Health

## 2020-04-30 ENCOUNTER — Other Ambulatory Visit: Payer: Self-pay

## 2020-04-30 ENCOUNTER — Encounter: Payer: Self-pay | Admitting: Adult Health

## 2020-04-30 VITALS — BP 138/90 | Temp 97.4°F | Wt 213.0 lb

## 2020-04-30 DIAGNOSIS — H1031 Unspecified acute conjunctivitis, right eye: Secondary | ICD-10-CM

## 2020-04-30 NOTE — Progress Notes (Signed)
Subjective:    Patient ID: Richard [name], male    DOB: 1986/08/04, 34 y.o.   MRN: [id number]  HPI 34 year old male who  has a past medical history of Stress fracture, right ankle, sequela and Subluxation of extensor carpi ulnaris tendon, right, sequela.   He presents to the office today for an acute complaint of redness to his right eye that has been present for about 3-4 days. He has been using over the counter allergy drops but this does not seem to be helping. He denies itching, pain, mucoid drainage from the right eye, waking up with his eye lids matted shut, or feeling of foreign body. He does report having mild pressure sensation but this has resolved. When he woke up this morning his eye was back to normal    Review of Systems See HPI   Past Medical History:  Diagnosis Date  . Stress fracture, right ankle, sequela   . Subluxation of extensor carpi ulnaris tendon, right, sequela     Social History   Socioeconomic History  . Marital status: Married    Spouse name: Not on file  . Number of children: Not on file  . Years of education: Not on file  . Highest education level: Not on file  Occupational History  . Not on file  Tobacco Use  . Smoking status: Never Smoker  . Smokeless tobacco: Never Used  Substance and Sexual Activity  . Alcohol use: Yes    Comment: social on weekend  . Drug use: Never  . Sexual activity: Yes    Partners: Female  Other Topics Concern  . Not on file  Social History Narrative  . Not on file   Social Determinants of Health   Financial Resource Strain:   . Difficulty of Paying Living Expenses:   Food Insecurity:   . Worried About Charity fundraiser in the Last Year:   . Arboriculturist in the Last Year:   Transportation Needs:   . Film/video editor (Medical):   Marland Kitchen Lack of Transportation (Non-Medical):   Physical Activity:   . Days of Exercise per Week:   . Minutes of Exercise per Session:   Stress:   . Feeling of Stress :     Social Connections:   . Frequency of Communication with Friends and Family:   . Frequency of Social Gatherings with Friends and Family:   . Attends Religious Services:   . Active Member of Clubs or Organizations:   . Attends Archivist Meetings:   Marland Kitchen Marital Status:   Intimate Partner Violence:   . Fear of Current or Ex-Partner:   . Emotionally Abused:   Marland Kitchen Physically Abused:   . Sexually Abused:     Past Surgical History:  Procedure Laterality Date  . TONSILLECTOMY AND ADENOIDECTOMY  1996  . WISDOM TOOTH EXTRACTION      Family History  Problem Relation Age of Onset  . High blood pressure Mother   . High Cholesterol Mother   . Arthritis-Osteo Father   . Hearing loss Father   . High blood pressure Father   . Asthma Sister   . Hearing loss Maternal Grandmother   . Heart disease Maternal Grandmother   . High blood pressure Maternal Grandmother   . High Cholesterol Maternal Grandmother   . Stroke Maternal Grandmother 80       TIAs  . Heart attack Maternal Grandfather 55  . Cancer Paternal Grandmother        ?  lung; smoker  . Hearing loss Paternal Grandfather   . Bladder Cancer Paternal Uncle   . Bladder Cancer Paternal Uncle     No Known Allergies  Current Outpatient Medications on File Prior to Visit  Medication Sig Dispense Refill  . fluticasone (FLONASE) 50 MCG/ACT nasal spray Place into both nostrils daily.    Marland Kitchen ibuprofen (ADVIL,MOTRIN) 600 MG tablet ibuprofen 600 mg tablet     No current facility-administered medications on file prior to visit.    BP 138/90   Temp (!) 97.4 F (36.3 C)   Wt 213 lb (96.6 kg)   BMI 30.13 kg/m       Objective:   Physical Exam Vitals and nursing note reviewed.  Constitutional:      Appearance: Normal appearance.  Eyes:     General:        Right eye: No discharge.        Left eye: No discharge.     Extraocular Movements: Extraocular movements intact.     Conjunctiva/sclera: Conjunctivae normal.     Pupils:  Pupils are equal, round, and reactive to light.  Neurological:     Mental Status: He is alert.       Assessment & Plan:  1. Acute conjunctivitis of right eye, unspecified acute conjunctivitis type - Viral vs Allergic but has since resolved. Not concerned for corneal abrasion.  - Advised to continue OTC eye drops for the next few days  - Follow up PRN   Dorothyann Peng, NP

## 2020-05-17 ENCOUNTER — Other Ambulatory Visit: Payer: Self-pay

## 2020-05-20 ENCOUNTER — Encounter: Payer: Self-pay | Admitting: Family Medicine

## 2020-05-20 ENCOUNTER — Other Ambulatory Visit: Payer: Self-pay

## 2020-05-20 ENCOUNTER — Ambulatory Visit (INDEPENDENT_AMBULATORY_CARE_PROVIDER_SITE_OTHER): Payer: 59 | Admitting: Family Medicine

## 2020-05-20 VITALS — BP 130/80 | Temp 98.0°F | Ht 70.5 in | Wt 215.0 lb

## 2020-05-20 DIAGNOSIS — Z131 Encounter for screening for diabetes mellitus: Secondary | ICD-10-CM | POA: Diagnosis not present

## 2020-05-20 DIAGNOSIS — Z0000 Encounter for general adult medical examination without abnormal findings: Secondary | ICD-10-CM | POA: Diagnosis not present

## 2020-05-20 DIAGNOSIS — Z13220 Encounter for screening for lipoid disorders: Secondary | ICD-10-CM | POA: Diagnosis not present

## 2020-05-20 DIAGNOSIS — Z23 Encounter for immunization: Secondary | ICD-10-CM

## 2020-05-20 DIAGNOSIS — D229 Melanocytic nevi, unspecified: Secondary | ICD-10-CM

## 2020-05-20 NOTE — Patient Instructions (Signed)
Dr. Marian Sorrow on [place]

## 2020-05-20 NOTE — Progress Notes (Signed)
Gilford Rile Arvidson DOB: 1985/12/28 Encounter date: 05/20/2020  This is a 34 y.o. male who presents for complete physical   History of present illness/Additional concerns:  Seasonal allergies:have been doing ok. Uses flonase in the morning; more congested at night - using breathe [facility].   Working from home during pandemic.  Referred to dermatology for skin monitoring at last physical 09/2018. Never ended up going to this but would like to retry referral. Has moles (not changing) that he would like to keep monitoring.   Past Medical History:  Diagnosis Date   Stress fracture, right ankle, sequela    Subluxation of extensor carpi ulnaris tendon, right, sequela    Past Surgical History:  Procedure Laterality Date   TONSILLECTOMY AND ADENOIDECTOMY  1996   WISDOM TOOTH EXTRACTION     No Known Allergies Current Meds  Medication Sig   fluticasone (FLONASE) 50 MCG/ACT nasal spray Place into both nostrils daily.   ibuprofen (ADVIL,MOTRIN) 600 MG tablet ibuprofen 600 mg tablet   Social History   Tobacco Use   Smoking status: Never Smoker   Smokeless tobacco: Never Used  Substance Use Topics   Alcohol use: Yes    Comment: social on weekend   Family History  Problem Relation Age of Onset   High blood pressure Mother    High Cholesterol Mother    Arthritis-Osteo Father    Hearing loss Father    High blood pressure Father    High Cholesterol Father    Asthma Sister    Hearing loss Maternal Grandmother    Heart disease Maternal Grandmother    High blood pressure Maternal Grandmother    High Cholesterol Maternal Grandmother    Stroke Maternal Grandmother 80       TIAs   Heart attack Maternal Grandfather 70   Cancer Paternal Grandmother        ?lung; smoker   Hearing loss Paternal Grandfather    Bladder Cancer Paternal Uncle    Bladder Cancer Paternal Uncle    Heart Problems Maternal Uncle        ?left ventricular issues   Other Maternal Uncle         cardiac disease     Review of Systems  Constitutional: Negative for activity change, appetite change, chills, fatigue, fever and unexpected weight change.  HENT: Negative for congestion, ear pain, hearing loss, sinus pressure, sinus pain, sore throat and trouble swallowing.   Eyes: Negative for pain and visual disturbance.  Respiratory: Negative for cough, chest tightness, shortness of breath and wheezing.   Cardiovascular: Negative for chest pain, palpitations and leg swelling.  Gastrointestinal: Negative for abdominal distention, abdominal pain, blood in stool, constipation, diarrhea, nausea and vomiting.  Genitourinary: Negative for decreased urine volume, difficulty urinating, dysuria, penile pain and testicular pain.  Musculoskeletal: Negative for arthralgias, back pain and joint swelling.  Skin: Negative for rash.  Neurological: Negative for dizziness, weakness, numbness and headaches.  Hematological: Negative for adenopathy. Does not bruise/bleed easily.  Psychiatric/Behavioral: Negative for agitation, sleep disturbance and suicidal ideas. The patient is not nervous/anxious.     CBC: No results found for: WBC, HGB, HCT, MCH, MCHC, RDW, PLT, MPV CMP: Lab Results  Component Value Date   NA 139 09/15/2018   K 4.3 09/15/2018   CL 101 09/15/2018   CO2 33 (H) 09/15/2018   GLUCOSE 87 09/15/2018   BUN 16 09/15/2018   CREATININE 0.99 09/15/2018   CALCIUM 9.4 09/15/2018   LIPID: Lab Results  Component Value Date  CHOL 188 09/15/2018   TRIG 95.0 09/15/2018   HDL 44.80 09/15/2018   LDLCALC 124 (H) 09/15/2018    Objective:  BP 130/80    Temp 98 F (36.7 C)    Ht 5' 10.5" (1.791 m)    Wt 215 lb (97.5 kg)    BMI 30.41 kg/m   Weight: 215 lb (97.5 kg)   BP Readings from Last 3 Encounters:  05/20/20 130/80  04/30/20 138/90  08/11/19 140/70   Wt Readings from Last 3 Encounters:  05/20/20 215 lb (97.5 kg)  04/30/20 213 lb (96.6 kg)  08/11/19 219 lb 3.2 oz (99.4 kg)     Physical Exam Constitutional:      General: He is not in acute distress.    Appearance: He is well-developed.  HENT:     Head: Normocephalic and atraumatic.     Right Ear: External ear normal.     Left Ear: External ear normal.     Nose: Nose normal.     Mouth/Throat:     Pharynx: No oropharyngeal exudate.  Eyes:     Conjunctiva/sclera: Conjunctivae normal.     Pupils: Pupils are equal, round, and reactive to light.  Neck:     Thyroid: No thyromegaly.  Cardiovascular:     Rate and Rhythm: Normal rate and regular rhythm.     Heart sounds: Normal heart sounds. No murmur heard.  No friction rub. No gallop.   Pulmonary:     Effort: Pulmonary effort is normal. No respiratory distress.     Breath sounds: Normal breath sounds. No stridor. No wheezing or rales.  Abdominal:     General: Bowel sounds are normal.     Palpations: Abdomen is soft.  Musculoskeletal:        General: Normal range of motion.     Cervical back: Neck supple.  Skin:    General: Skin is warm and dry.  Neurological:     Mental Status: He is alert and oriented to person, place, and time.  Psychiatric:        Behavior: Behavior normal.        Thought Content: Thought content normal.        Judgment: Judgment normal.     Assessment/Plan: Health Maintenance Due  Topic Date Due   Hepatitis C Screening  Never done   HIV Screening  Never done   Health Maintenance reviewed.  1. Preventative health care Keep up with regular exercise.  Keep up with healthy eating. - Tdap vaccine greater than or equal to 7yo IM - Hepatitis C antibody; Future  2. Lipid screening - Lipid panel; Future  3. Screening for diabetes mellitus - Comprehensive metabolic panel; Future  4. Need for Tdap vaccination Completed in office today.  5. Skin mole We will refer for routine annual skin checks.  No concerning skin lesions noted today. - Ambulatory referral to Dermatology  Return in about 1 year (around 05/20/2021)  for physical exam.  Micheline Rough, MD

## 2020-05-24 ENCOUNTER — Other Ambulatory Visit: Payer: Self-pay | Admitting: Family Medicine

## 2020-05-24 ENCOUNTER — Other Ambulatory Visit: Payer: 59

## 2020-05-24 ENCOUNTER — Encounter: Payer: Self-pay | Admitting: Family Medicine

## 2020-05-24 ENCOUNTER — Other Ambulatory Visit: Payer: Self-pay

## 2020-05-24 DIAGNOSIS — Z0000 Encounter for general adult medical examination without abnormal findings: Secondary | ICD-10-CM

## 2020-05-27 ENCOUNTER — Other Ambulatory Visit: Payer: Self-pay

## 2020-05-27 ENCOUNTER — Other Ambulatory Visit (INDEPENDENT_AMBULATORY_CARE_PROVIDER_SITE_OTHER): Payer: 59

## 2020-05-27 DIAGNOSIS — Z13220 Encounter for screening for lipoid disorders: Secondary | ICD-10-CM

## 2020-05-27 DIAGNOSIS — Z0000 Encounter for general adult medical examination without abnormal findings: Secondary | ICD-10-CM | POA: Diagnosis not present

## 2020-05-27 DIAGNOSIS — Z131 Encounter for screening for diabetes mellitus: Secondary | ICD-10-CM

## 2020-05-27 LAB — COMPREHENSIVE METABOLIC PANEL
ALT: 14 U/L (ref 0–53)
AST: 15 U/L (ref 0–37)
Albumin: 4.4 g/dL (ref 3.5–5.2)
Alkaline Phosphatase: 61 U/L (ref 39–117)
BUN: 18 mg/dL (ref 6–23)
CO2: 33 mEq/L — ABNORMAL HIGH (ref 19–32)
Calcium: 9.1 mg/dL (ref 8.4–10.5)
Chloride: 100 mEq/L (ref 96–112)
Creatinine, Ser: 1.03 mg/dL (ref 0.40–1.50)
GFR: 82.04 mL/min (ref >60.00)
Glucose, Bld: 94 mg/dL (ref 70–99)
Potassium: 3.9 mEq/L (ref 3.5–5.1)
Sodium: 138 mEq/L (ref 135–145)
Total Bilirubin: 0.8 mg/dL (ref 0.2–1.2)
Total Protein: 6.3 g/dL (ref 6.0–8.3)

## 2020-05-27 LAB — LIPID PANEL
Cholesterol: 153 mg/dL (ref 0–200)
HDL: 46.7 mg/dL (ref >39.00)
LDL Cholesterol: 90 mg/dL (ref 0–99)
NonHDL: 106.31
Total CHOL/HDL Ratio: 3
Triglycerides: 83 mg/dL (ref 0.0–149.0)
VLDL: 16.6 mg/dL (ref 0.0–40.0)

## 2020-05-28 LAB — HEPATITIS C ANTIBODY
Hepatitis C Ab: NONREACTIVE
SIGNAL TO CUT-OFF: 0.01 (ref <1.00)

## 2020-05-28 LAB — HIV ANTIBODY (ROUTINE TESTING W REFLEX): HIV 1&2 Ab, 4th Generation: NONREACTIVE

## 2020-07-25 DIAGNOSIS — Z20822 Contact with and (suspected) exposure to covid-19: Secondary | ICD-10-CM | POA: Diagnosis not present

## 2020-09-12 ENCOUNTER — Ambulatory Visit: Payer: 59 | Admitting: Physician Assistant

## 2020-09-12 ENCOUNTER — Other Ambulatory Visit: Payer: Self-pay

## 2020-09-12 DIAGNOSIS — D225 Melanocytic nevi of trunk: Secondary | ICD-10-CM | POA: Diagnosis not present

## 2020-09-12 DIAGNOSIS — D2272 Melanocytic nevi of left lower limb, including hip: Secondary | ICD-10-CM | POA: Diagnosis not present

## 2020-09-12 DIAGNOSIS — D485 Neoplasm of uncertain behavior of skin: Secondary | ICD-10-CM

## 2020-09-12 DIAGNOSIS — Z1283 Encounter for screening for malignant neoplasm of skin: Secondary | ICD-10-CM

## 2020-09-12 DIAGNOSIS — D229 Melanocytic nevi, unspecified: Secondary | ICD-10-CM

## 2020-09-12 NOTE — Patient Instructions (Signed)

## 2020-09-12 NOTE — Progress Notes (Signed)
° °  New Patient Visit  Subjective  Richard [name] is a 34 y.o. male who presents for the following: Skin Problem (Has mole on upper left thigh. It is dark but patient has always had it but PCP just wanted it checked out. ).  Objective  Well appearing patient in no apparent distress; mood and affect are within normal limits.  A full examination was performed including head, eyes, ears, nose, lips, neck, chest, axillae, abdomen, back, buttocks, bilateral upper extremities, bilateral lower extremities, hands, feet, fingers, toes, fingernails, and toenails. All findings within normal limits unless otherwise noted below. No suspicious moles noted on back.  Objective  Mid Back: Full body skin exam   Objective  Left Genitocrural Fold: Brown macule with irregular shape     Objective  Left Medial Thigh: Dark brown macule     Objective  Mid Back: Tan-brown symmetric macules and papules.   Assessment & Plan  Screening for malignant neoplasm of skin Mid Back  Neoplasm of uncertain behavior of skin (2) Left Genitocrural Fold  Skin / nail biopsy Type of biopsy: tangential   Informed consent: discussed and consent obtained   Timeout: patient name, date of birth, surgical site, and procedure verified   Procedure prep:  Patient was prepped and draped in usual sterile fashion (Non sterile) Prep type:  Chlorhexidine Anesthesia: the lesion was anesthetized in a standard fashion   Anesthetic:  1% lidocaine w/ epinephrine 1-100,000 local infiltration Instrument used: flexible razor blade   Outcome: patient tolerated procedure well   Post-procedure details: wound care instructions given    Specimen 1 - Surgical pathology Differential Diagnosis: atypia Check Margins: No  Left Medial Thigh  Skin / nail biopsy Type of biopsy: tangential   Informed consent: discussed and consent obtained   Timeout: patient name, date of birth, surgical site, and procedure verified   Procedure prep:   Patient was prepped and draped in usual sterile fashion (Non sterile) Prep type:  Chlorhexidine Anesthesia: the lesion was anesthetized in a standard fashion   Anesthetic:  1% lidocaine w/ epinephrine 1-100,000 local infiltration Instrument used: flexible razor blade   Outcome: patient tolerated procedure well   Post-procedure details: wound care instructions given    Specimen 2 - Surgical pathology Differential Diagnosis: atypia Check Margins: No  [place] Mid Back

## 2020-09-13 ENCOUNTER — Encounter: Payer: Self-pay | Admitting: Physician Assistant

## 2020-09-17 ENCOUNTER — Telehealth: Payer: Self-pay

## 2020-09-17 NOTE — Telephone Encounter (Signed)
Path to patient yearly check for follow up

## 2020-09-17 NOTE — Telephone Encounter (Signed)
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 Darene Lamer is comfortable with simply doing an annual skin examination.

## 2020-09-27 DIAGNOSIS — H52223 Regular astigmatism, bilateral: Secondary | ICD-10-CM | POA: Diagnosis not present

## 2020-09-27 DIAGNOSIS — H5203 Hypermetropia, bilateral: Secondary | ICD-10-CM | POA: Diagnosis not present

## 2020-10-19 ENCOUNTER — Ambulatory Visit: Payer: 59 | Attending: Internal Medicine

## 2020-10-19 DIAGNOSIS — Z23 Encounter for immunization: Secondary | ICD-10-CM

## 2020-10-19 NOTE — Progress Notes (Signed)
   Covid-19 Vaccination Clinic  Name:  Richard [name]    MRN: [id number] DOB: 07/21/1986  10/19/2020  [name] was observed post Covid-19 immunization for 15 minutes without incident. He was provided with Vaccine Information Sheet and instruction to access the V-Safe system.   [name] was instructed to call 911 with any severe reactions post vaccine: Marland Kitchen Difficulty breathing  . Swelling of face and throat  . A fast heartbeat  . A bad rash all over body  . Dizziness and weakness   Immunizations Administered    Name Date Dose VIS Date Route   Pfizer COVID-19 Vaccine 10/19/2020 10:55 AM 0.3 mL 09/25/2020 Intramuscular   Manufacturer: [place]   Lot: [id number]   [place]: [id number]

## 2020-12-25 DIAGNOSIS — Z1152 Encounter for screening for COVID-19: Secondary | ICD-10-CM | POA: Diagnosis not present

## 2020-12-27 ENCOUNTER — Encounter: Payer: Self-pay | Admitting: Family Medicine

## 2020-12-30 ENCOUNTER — Other Ambulatory Visit (HOSPITAL_COMMUNITY): Payer: Self-pay | Admitting: Family Medicine

## 2020-12-30 MED ORDER — BENZONATATE 100 MG PO CAPS
100.0000 mg | ORAL_CAPSULE | Freq: Three times a day (TID) | ORAL | 0 refills | Status: DC | PRN
Start: 2020-12-30 — End: 2021-05-23

## 2020-12-30 MED ORDER — ALBUTEROL SULFATE HFA 108 (90 BASE) MCG/ACT IN AERS: 2.0000 | INHALATION_SPRAY | Freq: Four times a day (QID) | RESPIRATORY_TRACT | 1 refills | Status: DC | PRN

## 2020-12-30 MED ORDER — SPACER/AERO-HOLDING CHAMBERS DEVI
1.0000 | Freq: Every day | 0 refills | Status: DC | PRN
Start: 2020-12-30 — End: 2021-05-23

## 2020-12-31 MED FILL — ALBUTEROL SULFATE HFA 108 (: 108 (90 BAS | 25 days supply | Qty: 18 | Fill #0

## 2020-12-31 MED FILL — BENZONATATE 100 MG CAPS: 100 | 10 days supply | Qty: 30 | Fill #0

## 2021-01-01 MED FILL — OPTICHAMBER DIAMOND VHC: 30 days supply | Qty: 1 | Fill #0

## 2021-03-04 NOTE — Progress Notes (Addendum)
[place] [address] [place] Phone: [phone] Subjective:   I Richard [name] am serving as a Education administrator for Dr. Hulan Saas.  This visit occurred during the SARS-CoV-2 public health emergency.  Safety protocols were in place, including screening questions prior to the visit, additional usage of staff PPE, and extensive cleaning of exam room while observing appropriate contact time as indicated for disinfecting solutions.   I'm seeing this patient by the request  of:  Koberlein, Steele Berg, MD  CC: Left knee pain  VPX:[id number]  Richard [name] is a 35 y.o. male coming in with complaint of L knee pain. Patient states can't really remember specific MOI. States that sometimes with walking his knee feels weak and will buckle. Sometimes it will buckle on stairs. Pain keeps him up at night. Knee pops non stop.  Onset- 3 weeks  Location - posterior, tibial tuberosity  Duration- Consistent throughout the day  Character- tightness Aggravating factors- playing tennis, stairs, planting with a turn or running Reliving factors-  Therapies tried- ibuprofen, ice Severity- 5/10 at its worse     Past Medical History:  Diagnosis Date  . Stress fracture, right ankle, sequela   . Subluxation of extensor carpi ulnaris tendon, right, sequela    Past Surgical History:  Procedure Laterality Date  . TONSILLECTOMY AND ADENOIDECTOMY  1996  . WISDOM TOOTH EXTRACTION     Social History   Socioeconomic History  . Marital status: Married    Spouse name: Not on file  . Number of children: Not on file  . Years of education: Not on file  . Highest education level: Not on file  Occupational History  . Not on file  Tobacco Use  . Smoking status: Never Smoker  . Smokeless tobacco: Never Used  Substance and Sexual Activity  . Alcohol use: Yes    Comment: social on weekend  . Drug use: Never  . Sexual activity: Yes    Partners: Female  Other  Topics Concern  . Not on file  Social History Narrative  . Not on file   Social Determinants of Health   Financial Resource Strain: Not on file  Food Insecurity: Not on file  Transportation Needs: Not on file  Physical Activity: Not on file  Stress: Not on file  Social Connections: Not on file   No Known Allergies Family History  Problem Relation Age of Onset  . High blood pressure Mother   . High Cholesterol Mother   . Arthritis-Osteo Father   . Hearing loss Father   . High blood pressure Father   . High Cholesterol Father   . Asthma Sister   . Hearing loss Maternal Grandmother   . Heart disease Maternal Grandmother   . High blood pressure Maternal Grandmother   . High Cholesterol Maternal Grandmother   . Stroke Maternal Grandmother 80       TIAs  . Heart attack Maternal Grandfather 54  . Cancer Paternal Grandmother        ?lung; smoker  . Hearing loss Paternal Grandfather   . Bladder Cancer Paternal Uncle   . Bladder Cancer Paternal Uncle   . Heart Problems Maternal Uncle        ?left ventricular issues  . Other Maternal Uncle        cardiac disease      Current Outpatient Medications (Respiratory):  .  benzonatate (TESSALON PERLES) 100 MG capsule, Take 1 capsule (100 mg total) by mouth  3 (three) times daily as needed for cough. .  fluticasone (FLONASE) 50 MCG/ACT nasal spray, Place into both nostrils daily. Marland Kitchen  albuterol (VENTOLIN HFA) 108 (90 Base) MCG/ACT inhaler, Inhale 2 puffs into the lungs every 6 (six) hours as needed for up to 10 days for wheezing or shortness of breath.  Current Outpatient Medications (Analgesics):  .  ibuprofen (ADVIL,MOTRIN) 600 MG tablet, ibuprofen 600 mg tablet   Current Outpatient Medications (Other):  .  Spacer/Aero-Holding Chambers DEVI, 1 Device by Does not apply route daily as needed.   Reviewed prior external information including notes and imaging from  primary care provider As well as notes that were available from  care everywhere and other healthcare systems.  Past medical history, social, surgical and family history all reviewed in electronic medical record.  No pertanent information unless stated regarding to the chief complaint.   Review of Systems:  No headache, visual changes, nausea, vomiting, diarrhea, constipation, dizziness, abdominal pain, skin rash, fevers, chills, night sweats, weight loss, swollen lymph nodes, body aches, joint swelling, chest pain, shortness of breath, mood changes. POSITIVE muscle aches  Objective  Blood pressure 114/90, pulse 72, height 5' 10.5" (1.791 m), weight 220 lb (99.8 kg), SpO2 98 %.   General: No apparent distress alert and oriented x3 mood and affect normal, dressed appropriately.  HEENT: Pupils equal, extraocular movements intact  Respiratory: Patient's speak in full sentences and does not appear short of breath  Cardiovascular: No lower extremity edema, non tender, no erythema  Gait normal with good balance and coordination.  MSK:  Knee: Left Fullness noted in the popliteal area.  Near full range of motion lacking last 5 degrees.  Very mild positive McMurray's.  Good strength of the knee noted.  Mild tenderness over the medial joint line.  Tracking of the patella unremarkable.  MSK US performed of: Left knee This study was ordered, performed, and interpreted by Charlann Boxer D.O.  Knee:  Patient's posterior medial meniscus Appears to be an acute on chronic tear noted.  Approximately 25% displacement noted.  Patient does have a fairly large Baker's cyst noted.  Normal joint space and lateral joint space unremarkable.  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  Verbal informed consent obtained.  Time-out conducted.  Noted no overlying erythema, induration, or other signs of local infection.  Skin prepped in a sterile fashion.  Local anesthesia: Topical Ethyl chloride.  With sterile technique and under real time ultrasound guidance: With a 22-gauge 2 inch needle patient was injected with 4 cc of 0.5% Marcaine and aspirated 25 cc of straw-colored fluid then injected 1 cc of Kenalog 40 mg/dL. This was from a posterior approach Completed without difficulty  Pain immediately resolved suggesting accurate placement of the medication.  Advised to call if fevers/chills, erythema, induration, drainage, or persistent bleeding.   Impression: Technically successful ultrasound guided injection.   Impression and Recommendations:     The above documentation has been reviewed and is accurate and complete Lyndal Pulley, DO

## 2021-03-05 ENCOUNTER — Ambulatory Visit: Payer: Self-pay

## 2021-03-05 ENCOUNTER — Ambulatory Visit (INDEPENDENT_AMBULATORY_CARE_PROVIDER_SITE_OTHER): Payer: 59

## 2021-03-05 ENCOUNTER — Encounter: Payer: Self-pay | Admitting: Family Medicine

## 2021-03-05 ENCOUNTER — Ambulatory Visit: Payer: 59 | Admitting: Family Medicine

## 2021-03-05 ENCOUNTER — Other Ambulatory Visit: Payer: Self-pay

## 2021-03-05 VITALS — BP 114/90 | HR 72 | Ht 70.5 in | Wt 220.0 lb

## 2021-03-05 DIAGNOSIS — S83242A Other tear of medial meniscus, current injury, left knee, initial encounter: Secondary | ICD-10-CM | POA: Diagnosis not present

## 2021-03-05 DIAGNOSIS — M7122 Synovial cyst of popliteal space [Baker], left knee: Secondary | ICD-10-CM

## 2021-03-05 DIAGNOSIS — M25562 Pain in left knee: Secondary | ICD-10-CM

## 2021-03-05 DIAGNOSIS — G8929 Other chronic pain: Secondary | ICD-10-CM | POA: Diagnosis not present

## 2021-03-05 IMAGING — DX DG KNEE COMPLETE 4+V*L*
4 series · 4 of 4 positions shown · non-contrast
Comparison: None.

CLINICAL DATA: Left knee pain, weakness and occasional buckling. No
known injury.

EXAM:
LEFT KNEE - COMPLETE 4+ VIEW

[knee ap]
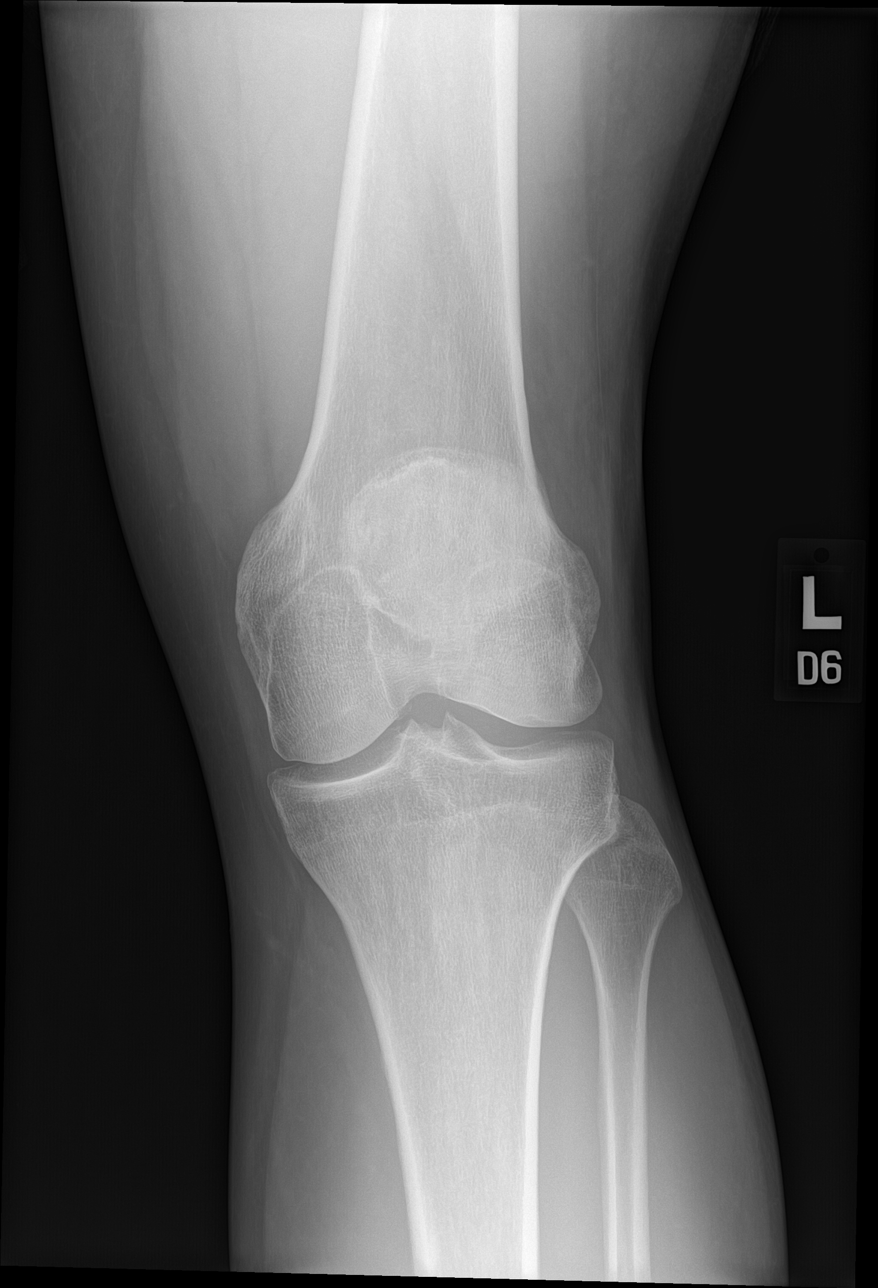

[knee obl (1 of 2)]
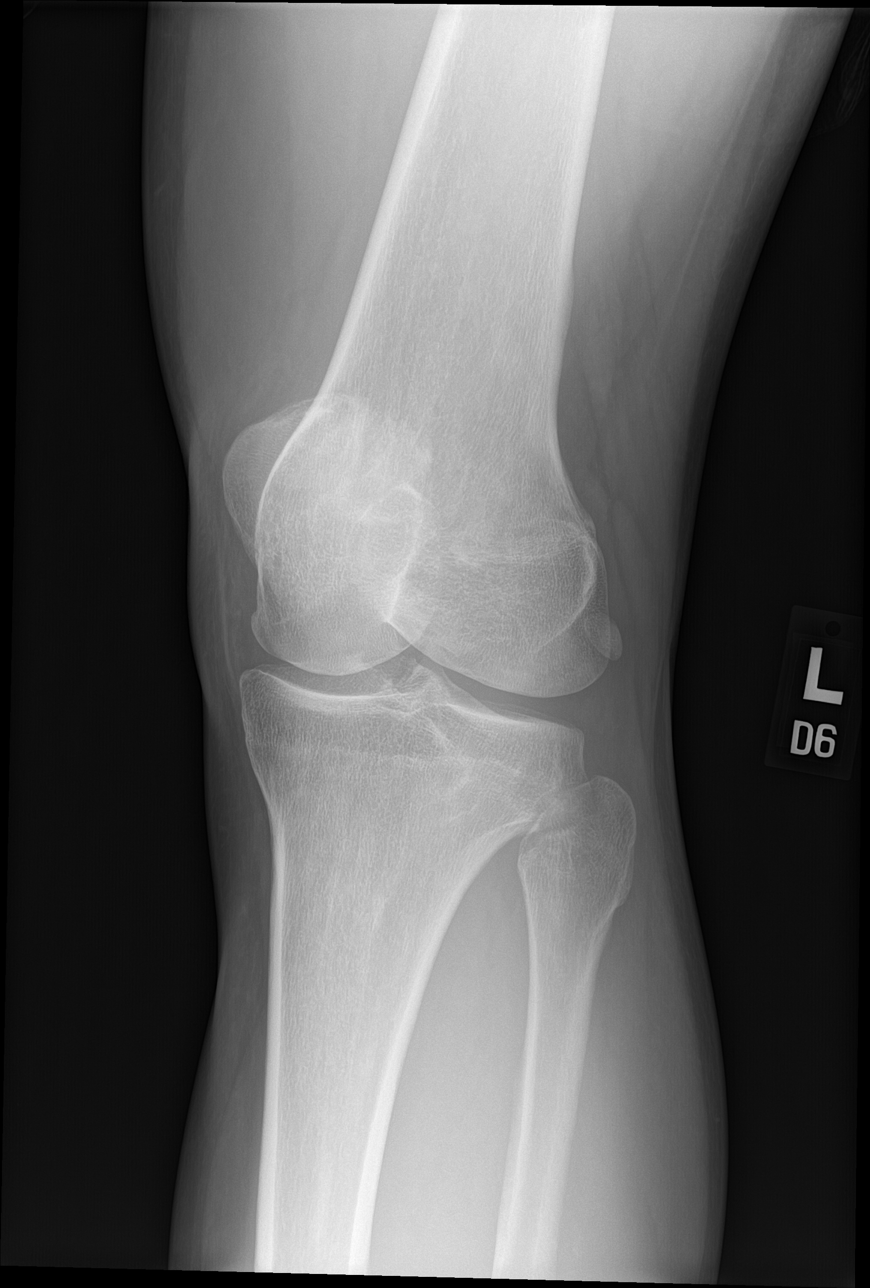

[knee obl (2 of 2)]
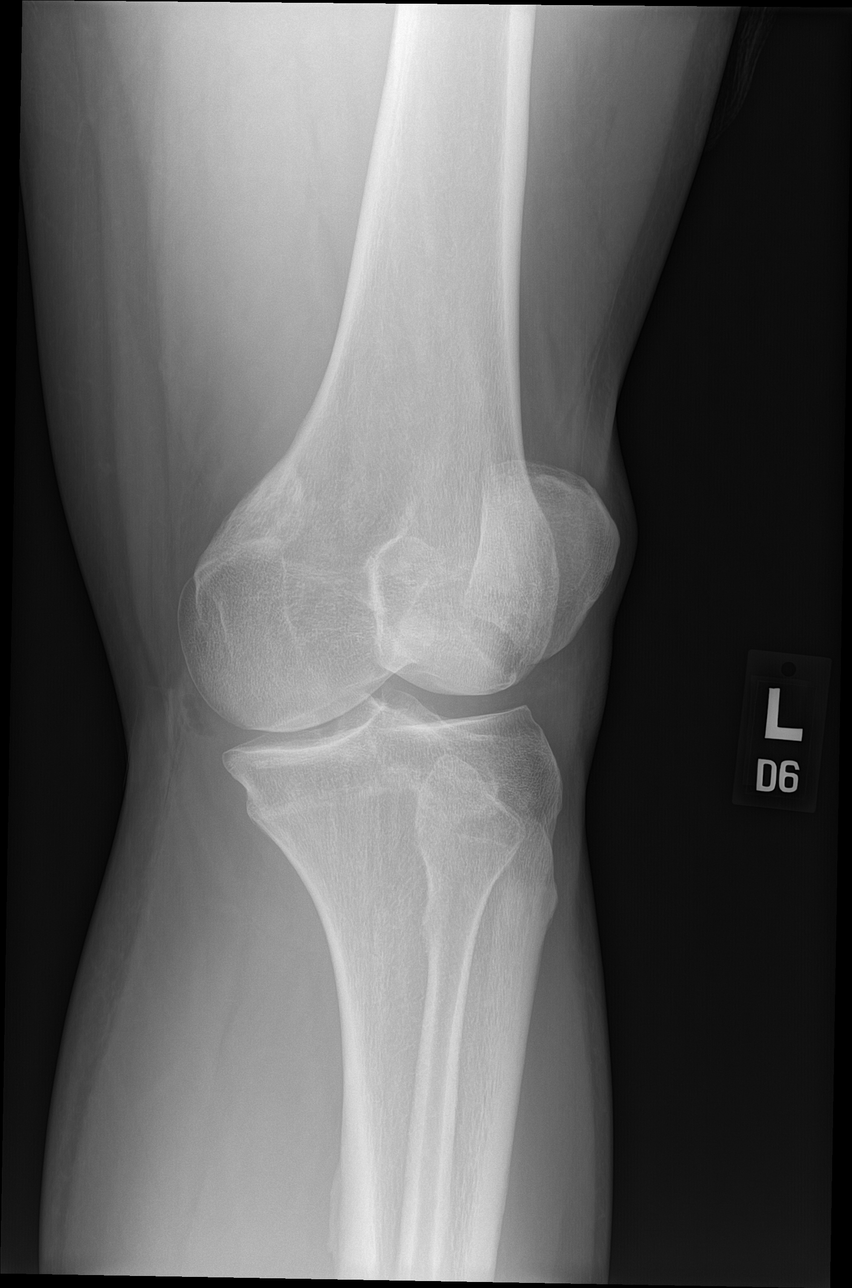

[knee lat]
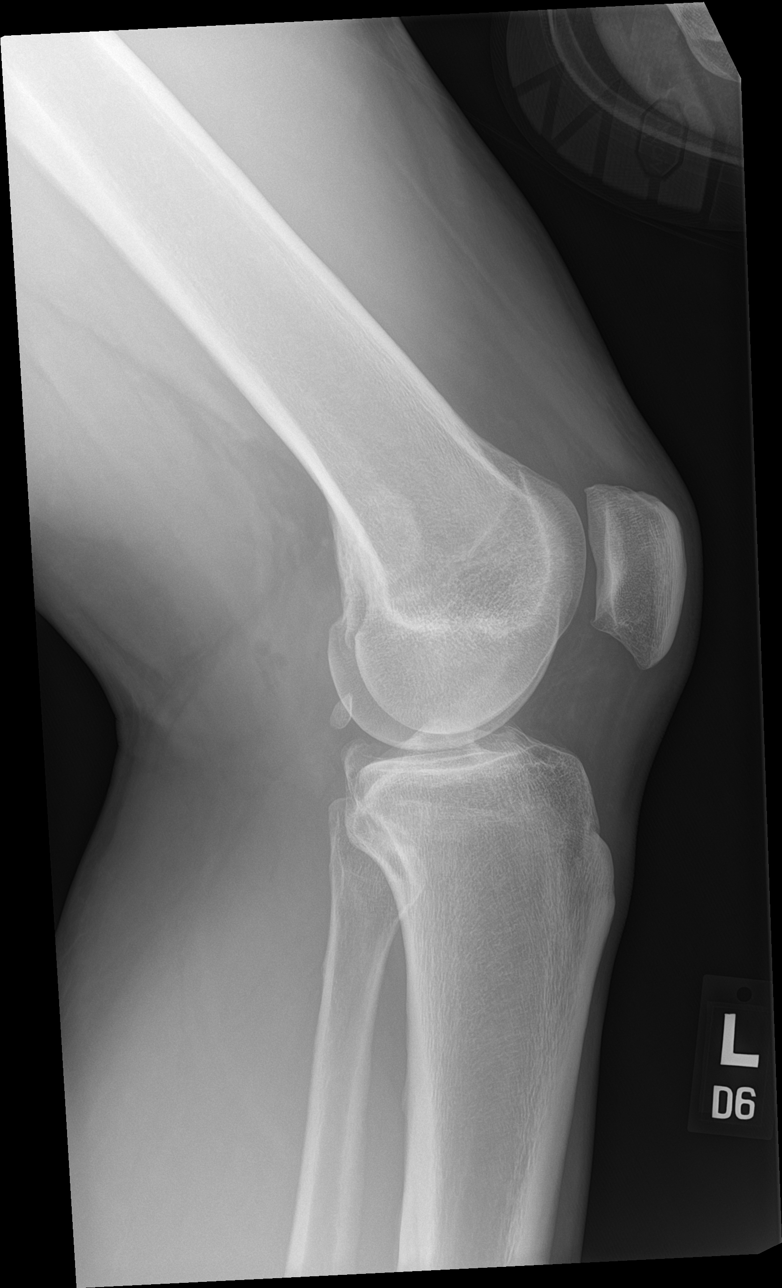

[4 of 4 positions shown; findings below may reference images not displayed]

FINDINGS: No evidence of fracture, dislocation, or joint effusion. No evidence
of arthropathy or other focal bone abnormality. Soft tissues are
unremarkable.
IMPRESSION: Negative exam.

## 2021-03-05 NOTE — Assessment & Plan Note (Signed)
Patient did have a Baker's cyst.  Aspiration done today.  Acute on chronic meniscus noted.  X-rays pending.  Discussed avoiding twisting motions.  Decompression to avoid excess reaccumulation.  We will see how patient responds.  Follow-up with me again in 4 weeks.  Worsening pain and locking will need advanced imaging

## 2021-03-05 NOTE — Patient Instructions (Addendum)
Good to see you Knee xray today Drained the knee today Compression sleeve Ice 20 mins 2 times a day Exercise 3 times a week Pennsaid 2 times aa day See me again in 4-5 weeks if not much better MRI

## 2021-03-05 NOTE — Assessment & Plan Note (Signed)
Aspiration done today.  Tolerated the procedure well, topical anti-inflammatories given.  Discussed compression.  Follow-up again in 4 weeks.

## 2021-03-05 NOTE — Addendum Note (Signed)
Addended by: Cresenciano Lick on: 03/05/2021 02:03 PM   Modules accepted: Orders

## 2021-03-06 LAB — SYNOVIAL FLUID ANALYSIS, COMPLETE
Basophils, %: 0 %
Eosinophils-Synovial: 0 % (ref 0–2)
Lymphocytes-Synovial Fld: 84 % — ABNORMAL HIGH (ref 0–74)
Monocyte/Macrophage: 10 % (ref 0–69)
Neutrophil, Synovial: 6 % (ref 0–24)
Synoviocytes, %: 0 % (ref 0–15)
WBC, Synovial: 316 cells/uL — ABNORMAL HIGH (ref <150)

## 2021-03-11 ENCOUNTER — Ambulatory Visit: Payer: 59 | Admitting: Family Medicine

## 2021-04-02 NOTE — Progress Notes (Signed)
[facility] [place] Phone: [phone] Subjective:   Richard [name], am serving as a scribe for Dr. Hulan Saas. This visit occurred during the SARS-CoV-2 public health emergency.  Safety protocols were in place, including screening questions prior to the visit, additional usage of staff PPE, and extensive cleaning of exam room while observing appropriate contact time as indicated for disinfecting solutions.   I'm seeing this patient by the request  of:  Caren Macadam, MD  CC: Left knee pain  ALP:[id number]   03/05/2021 Aspiration done today.  Tolerated the procedure well, topical anti-inflammatories given.  Discussed compression.  Follow-up again in 4 weeks  Patient did have a Baker's cyst.  Aspiration done today.  Acute on chronic meniscus noted.  X-rays pending.  Discussed avoiding twisting motions.  Decompression to avoid excess reaccumulation.  We will see how patient responds.  Follow-up with me again in 4 weeks.  Worsening pain and locking will need advanced imaging  Update 04/03/2021 Richard [name] is a 35 y.o. male coming in with complaint of L knee pain. Pain improved right after last visit. A few weeks later pain returned and then went away. Has had soreness over past few days. Pain over sides of patellar tendon. [name] pain in the posterior aspect of knee. Does note crepitus with inclines and stairs. [name] giving away.      Past Medical History:  Diagnosis Date  . Stress fracture, right ankle, sequela   . Subluxation of extensor carpi ulnaris tendon, right, sequela    Past Surgical History:  Procedure Laterality Date  . TONSILLECTOMY AND ADENOIDECTOMY  1996  . WISDOM TOOTH EXTRACTION     Social History   Socioeconomic History  . Marital status: Married    Spouse name: Not on file  . Number of children: Not on file  . Years of education: Not on file  . Highest education level: Not on file   Occupational History  . Not on file  Tobacco Use  . Smoking status: Never Smoker  . Smokeless tobacco: Never Used  Substance and Sexual Activity  . Alcohol use: Yes    Comment: social on weekend  . Drug use: Never  . Sexual activity: Yes    Partners: Female  Other Topics Concern  . Not on file  Social History Narrative  . Not on file   Social Determinants of Health   Financial Resource Strain: Not on file  Food Insecurity: Not on file  Transportation Needs: Not on file  Physical Activity: Not on file  Stress: Not on file  Social Connections: Not on file   [name] Known Allergies Family History  Problem Relation Age of Onset  . High blood pressure Mother   . High Cholesterol Mother   . Arthritis-Osteo Father   . Hearing loss Father   . High blood pressure Father   . High Cholesterol Father   . Asthma Sister   . Hearing loss Maternal Grandmother   . Heart disease Maternal Grandmother   . High blood pressure Maternal Grandmother   . High Cholesterol Maternal Grandmother   . Stroke Maternal Grandmother 80       TIAs  . Heart attack Maternal Grandfather 54  . Cancer Paternal Grandmother        ?lung; smoker  . Hearing loss Paternal Grandfather   . Bladder Cancer Paternal Uncle   . Bladder Cancer Paternal Uncle   . Heart Problems Maternal Uncle        ?  left ventricular issues  . Other Maternal Uncle        cardiac disease      Current Outpatient Medications (Respiratory):  .  albuterol (VENTOLIN HFA) 108 (90 Base) MCG/ACT inhaler, INHALE 2 PUFFS BY MOUTH INTO THE LUNGS EVERY 6 HOURS AS NEEDED FOR WHEEZING OR SHORTNESS OF BREATH .  benzonatate (TESSALON PERLES) 100 MG capsule, Take 1 capsule (100 mg total) by mouth 3 (three) times daily as needed for cough. .  benzonatate (TESSALON) 100 MG capsule, TAKE 1 CAPSULE BY MOUTH 3 TIMES DAILY AS NEEDED FOR COUGH .  fluticasone (FLONASE) 50 MCG/ACT nasal spray, Place into both nostrils daily. Marland Kitchen  albuterol (VENTOLIN HFA) 108  (90 Base) MCG/ACT inhaler, Inhale 2 puffs into the lungs every 6 (six) hours as needed for up to 10 days for wheezing or shortness of breath.  Current Outpatient Medications (Analgesics):  .  ibuprofen (ADVIL,MOTRIN) 600 MG tablet, ibuprofen 600 mg tablet   Current Outpatient Medications (Other):  .  Spacer/Aero-Holding Chambers [facility] DIAMOND) MISC, USE AS DIRECTED .  Spacer/Aero-Holding Dorise Bullion, 1 Device by Does not apply route daily as needed.   Reviewed prior external information including notes and imaging from  primary care provider As well as notes that were available from care everywhere and other healthcare systems.  Past medical history, social, surgical and family history all reviewed in electronic medical record.  [name] pertanent information unless stated regarding to the chief complaint.   Review of Systems:  [name] headache, visual changes, nausea, vomiting, diarrhea, constipation, dizziness, abdominal pain, skin rash, fevers, chills, night sweats, weight loss, swollen lymph nodes, body aches, joint swelling, chest pain, shortness of breath, mood changes.   Objective  Blood pressure 126/88, pulse 72, height 5\' 10"  (1.778 m), weight 220 lb (99.8 kg), SpO2 98 %.   General: [name] apparent distress alert and oriented x3 mood and affect normal, dressed appropriately.  HEENT: Pupils equal, extraocular movements intact  Respiratory: Patient's speak in full sentences and does not appear short of breath  Cardiovascular: [name] lower extremity edema, non tender, [name] erythema  Gait normal with good balance and coordination.  MSK:  Non tender with full range of motion and good stability and symmetric strength and tone of shoulders, elbows, wrist, hip, and ankles bilaterally.  Left knee exam shows [name] significant swelling.  Full range of motion which is an improvement from previous exam.  Negative McMurray's today which is also an improvement.  Very mild tenderness noted over the medial  joint line.  Contralateral knee unremarkable  Limited musculoskeletal ultrasound was performed and interpreted by Lyndal Pulley  Limited ultrasound shows the patient's left knee still shows a posterior medial meniscus with mild displacement that is consistent with a tear.  The patient does have significant decrease in hypoechoic changes noted.  Patient's Baker's cyst is still significantly smaller than what it was previously.  [name] acute findings otherwise. Impression: Interval improvement   Impression and Recommendations:     The above documentation has been reviewed and is accurate and complete Lyndal Pulley, DO

## 2021-04-03 ENCOUNTER — Ambulatory Visit: Payer: Self-pay

## 2021-04-03 ENCOUNTER — Other Ambulatory Visit: Payer: Self-pay

## 2021-04-03 ENCOUNTER — Encounter: Payer: Self-pay | Admitting: Family Medicine

## 2021-04-03 ENCOUNTER — Ambulatory Visit (INDEPENDENT_AMBULATORY_CARE_PROVIDER_SITE_OTHER): Payer: 59 | Admitting: Family Medicine

## 2021-04-03 VITALS — BP 126/88 | HR 72 | Ht 70.0 in | Wt 220.0 lb

## 2021-04-03 DIAGNOSIS — M25562 Pain in left knee: Secondary | ICD-10-CM

## 2021-04-03 DIAGNOSIS — S83242A Other tear of medial meniscus, current injury, left knee, initial encounter: Secondary | ICD-10-CM | POA: Diagnosis not present

## 2021-04-03 DIAGNOSIS — M7122 Synovial cyst of popliteal space [Baker], left knee: Secondary | ICD-10-CM | POA: Diagnosis not present

## 2021-04-03 NOTE — Patient Instructions (Signed)
Looks like it is improving Keep increasing activity Have fun in [place] See me in 6-8 weeks

## 2021-04-03 NOTE — Assessment & Plan Note (Signed)
No significant reaccumulation noted today.

## 2021-04-03 NOTE — Assessment & Plan Note (Signed)
Patient does have of meniscal tear still noted of the medial posterior aspect.  Discussed with patient that though the displacement seems to be better.  Patient subjectively is 70% better as well.  Discussed at this point that as long as he does well can start increasing activity as tolerated.  Follow-up with me again in 6 to 8 weeks.  Worsening pain, locking or giving out on him I do feel advanced imaging would be warranted.  X-rays were normal which were independently visualized by me today.

## 2021-05-19 NOTE — Progress Notes (Signed)
Richard [name] [place] Phone: [phone] Subjective:   Richard [name], am serving as a scribe for Dr. Hulan Saas. This visit occurred during the SARS-CoV-2 public health emergency.  Safety protocols were in place, including screening questions prior to the visit, additional usage of staff PPE, and extensive cleaning of exam room while observing appropriate contact time as indicated for disinfecting solutions.    I'm seeing this patient by the request  of:  Caren Macadam, MD  CC: Left knee pain  WHQ:[id number]  04/03/2021 Patient does have of meniscal tear still noted of the medial posterior aspect.  Discussed with patient that though the displacement seems to be better.  Patient subjectively is 70% better as well.  Discussed at this point that as long as he does well can start increasing activity as tolerated.  Follow-up with me again in 6 to 8 weeks.  Worsening pain, locking or giving out on him I do feel advanced imaging would be warranted.  X-rays were normal which were independently visualized by me today.  Update 05/20/2021 Richard [name] is a 35 y.o. male coming in with complaint of L knee pain. Patient states that he was doing better but in the last week his pain has felt the same as it was last visit. Has been trying to play more tennis and did trail some trail running. Using compression sleeve when he plays tennis.      Past Medical History:  Diagnosis Date   Stress fracture, right ankle, sequela    Subluxation of extensor carpi ulnaris tendon, right, sequela    Past Surgical History:  Procedure Laterality Date   TONSILLECTOMY AND ADENOIDECTOMY  1996   WISDOM TOOTH EXTRACTION     Social History   Socioeconomic History   Marital status: Married    Spouse name: Not on file   Number of children: Not on file   Years of education: Not on file   Highest education level: Not on file  Occupational History    Not on file  Tobacco Use   Smoking status: Never   Smokeless tobacco: Never  Substance and Sexual Activity   Alcohol use: Yes    Comment: social on weekend   Drug use: Never   Sexual activity: Yes    Partners: Female  Other Topics Concern   Not on file  Social History Narrative   Not on file   Social Determinants of Health   Financial Resource Strain: Not on file  Food Insecurity: Not on file  Transportation Needs: Not on file  Physical Activity: Not on file  Stress: Not on file  Social Connections: Not on file   [name] Known Allergies Family History  Problem Relation Age of Onset   High blood pressure Mother    High Cholesterol Mother    Arthritis-Osteo Father    Hearing loss Father    High blood pressure Father    High Cholesterol Father    Asthma Sister    Hearing loss Maternal Grandmother    Heart disease Maternal Grandmother    High blood pressure Maternal Grandmother    High Cholesterol Maternal Grandmother    Stroke Maternal Grandmother 80       TIAs   Heart attack Maternal Grandfather 54   Cancer Paternal Grandmother        ?lung; smoker   Hearing loss Paternal Grandfather    Bladder Cancer Paternal Uncle    Bladder Cancer Paternal  Uncle    Heart Problems Maternal Uncle        ?left ventricular issues   Other Maternal Uncle        cardiac disease      Current Outpatient Medications (Respiratory):    albuterol (VENTOLIN HFA) 108 (90 Base) MCG/ACT inhaler, INHALE 2 PUFFS BY MOUTH INTO THE LUNGS EVERY 6 HOURS AS NEEDED FOR WHEEZING OR SHORTNESS OF BREATH   benzonatate (TESSALON PERLES) 100 MG capsule, Take 1 capsule (100 mg total) by mouth 3 (three) times daily as needed for cough.   benzonatate (TESSALON) 100 MG capsule, TAKE 1 CAPSULE BY MOUTH 3 TIMES DAILY AS NEEDED FOR COUGH   fluticasone (FLONASE) 50 MCG/ACT nasal spray, Place into both nostrils daily.   albuterol (VENTOLIN HFA) 108 (90 Base) MCG/ACT inhaler, Inhale 2 puffs into the lungs every 6  (six) hours as needed for up to 10 days for wheezing or shortness of breath.  Current Outpatient Medications (Analgesics):    ibuprofen (ADVIL,MOTRIN) 600 MG tablet, ibuprofen 600 mg tablet   Current Outpatient Medications (Other):    Spacer/Aero-Holding Chambers [facility] DIAMOND) MISC, USE AS DIRECTED   Spacer/Aero-Holding Dorise Bullion, 1 Device by Does not apply route daily as needed.   Reviewed prior external information including notes and imaging from  primary care provider As well as notes that were available from care everywhere and other healthcare systems.  Past medical history, social, surgical and family history all reviewed in electronic medical record.  [name] pertanent information unless stated regarding to the chief complaint.   Review of Systems:  [name] headache, visual changes, nausea, vomiting, diarrhea, constipation, dizziness, abdominal pain, skin rash, fevers, chills, night sweats, weight loss, swollen lymph nodes, body aches, joint swelling, chest pain, shortness of breath, mood changes. POSITIVE muscle aches  Objective  Blood pressure 122/86, pulse 80, height 5\' 10"  (1.778 m), weight 221 lb (100.2 kg), SpO2 98 %.   General: [name] apparent distress alert and oriented x3 mood and affect normal, dressed appropriately.  HEENT: Pupils equal, extraocular movements intact  Respiratory: Patient's speak in full sentences and does not appear short of breath  Cardiovascular: [name] lower extremity edema, non tender, [name] erythema  Gait normal with good balance and coordination.  MSK: Left knee exam shows the patient is still minorly tender over the medial joint line.  Mild positive McMurray's noted.  Crepitus noted of the knee especially of the patellofemoral which is different.  Limited musculoskeletal ultrasound was performed and interpreted by Lyndal Pulley  Limited ultrasound of patient's left knee shows the patient does have a patellofemoral effusion noted.  Patient does have  some siblings that could be secondary to either calcific loose bodies in the knee or the possibility of a abnormal synovitis.  Medial meniscus still have abnormality noted. Impression: New effusion of the patellofemoral joint with questionable loose body and continue medial meniscal changes    Impression and Recommendations:    The above documentation has been reviewed and is accurate and complete Lyndal Pulley, DO

## 2021-05-20 ENCOUNTER — Encounter: Payer: Self-pay | Admitting: Family Medicine

## 2021-05-20 ENCOUNTER — Ambulatory Visit: Payer: Self-pay

## 2021-05-20 ENCOUNTER — Ambulatory Visit: Payer: 59 | Admitting: Family Medicine

## 2021-05-20 ENCOUNTER — Other Ambulatory Visit: Payer: Self-pay

## 2021-05-20 VITALS — BP 122/86 | HR 80 | Ht 70.0 in | Wt 221.0 lb

## 2021-05-20 DIAGNOSIS — G8929 Other chronic pain: Secondary | ICD-10-CM

## 2021-05-20 DIAGNOSIS — S83242A Other tear of medial meniscus, current injury, left knee, initial encounter: Secondary | ICD-10-CM

## 2021-05-20 DIAGNOSIS — M25562 Pain in left knee: Secondary | ICD-10-CM

## 2021-05-20 NOTE — Assessment & Plan Note (Signed)
Patient had aspiration of the knee previously but now does have more increased swelling noted of the patellofemoral joint as well and continues to have what appears to be symptoms consistent with a possible medial meniscal tear.  Patient has failed all conservative therapy including greater than 3 months of therapy, home exercises, as well as 1 injection.  I do feel at this point and advanced imaging is warranted.  Patient could be a candidate for potential injections or we will consider the possibility of surgical intervention if needed.  We will await imaging.

## 2021-05-20 NOTE — Patient Instructions (Signed)
MRI [facility] [phone] Tru pull lite with activity Will chat through [employer]

## 2021-05-22 ENCOUNTER — Other Ambulatory Visit: Payer: Self-pay

## 2021-05-23 ENCOUNTER — Encounter: Payer: Self-pay | Admitting: Family Medicine

## 2021-05-23 ENCOUNTER — Ambulatory Visit (INDEPENDENT_AMBULATORY_CARE_PROVIDER_SITE_OTHER): Payer: 59 | Admitting: Family Medicine

## 2021-05-23 VITALS — BP 118/88 | HR 59 | Temp 97.8°F | Ht 70.5 in | Wt 219.2 lb

## 2021-05-23 DIAGNOSIS — Z13220 Encounter for screening for lipoid disorders: Secondary | ICD-10-CM

## 2021-05-23 DIAGNOSIS — Z0000 Encounter for general adult medical examination without abnormal findings: Secondary | ICD-10-CM

## 2021-05-23 DIAGNOSIS — Z131 Encounter for screening for diabetes mellitus: Secondary | ICD-10-CM

## 2021-05-23 LAB — LIPID PANEL
Cholesterol: 170 mg/dL (ref 0–200)
HDL: 46.7 mg/dL (ref >39.00)
LDL Cholesterol: 104 mg/dL — ABNORMAL HIGH (ref 0–99)
NonHDL: 123.26
Total CHOL/HDL Ratio: 4
Triglycerides: 95 mg/dL (ref 0.0–149.0)
VLDL: 19 mg/dL (ref 0.0–40.0)

## 2021-05-23 LAB — COMPREHENSIVE METABOLIC PANEL
ALT: 16 U/L (ref 0–53)
AST: 16 U/L (ref 0–37)
Albumin: 4.8 g/dL (ref 3.5–5.2)
Alkaline Phosphatase: 56 U/L (ref 39–117)
BUN: 14 mg/dL (ref 6–23)
CO2: 31 mEq/L (ref 19–32)
Calcium: 9.6 mg/dL (ref 8.4–10.5)
Chloride: 102 mEq/L (ref 96–112)
Creatinine, Ser: 1.06 mg/dL (ref 0.40–1.50)
GFR: 90.43 mL/min (ref >60.00)
Glucose, Bld: 92 mg/dL (ref 70–99)
Potassium: 4.2 mEq/L (ref 3.5–5.1)
Sodium: 139 mEq/L (ref 135–145)
Total Bilirubin: 0.9 mg/dL (ref 0.2–1.2)
Total Protein: 7.1 g/dL (ref 6.0–8.3)

## 2021-05-23 LAB — CBC WITH DIFFERENTIAL/PLATELET
Basophils Absolute: 0 10*3/uL (ref 0.0–0.1)
Basophils Relative: 0.6 % (ref 0.0–3.0)
Eosinophils Absolute: 0.1 10*3/uL (ref 0.0–0.7)
Eosinophils Relative: 1.4 % (ref 0.0–5.0)
HCT: 40.7 % (ref 39.0–52.0)
Hemoglobin: 14.3 g/dL (ref 13.0–17.0)
Lymphocytes Relative: 26.4 % (ref 12.0–46.0)
Lymphs Abs: 1.2 10*3/uL (ref 0.7–4.0)
MCHC: 35.2 g/dL (ref 30.0–36.0)
MCV: 87 fl (ref 78.0–100.0)
Monocytes Absolute: 0.3 10*3/uL (ref 0.1–1.0)
Monocytes Relative: 7.5 % (ref 3.0–12.0)
Neutro Abs: 2.9 10*3/uL (ref 1.4–7.7)
Neutrophils Relative %: 64.1 % (ref 43.0–77.0)
Platelets: 192 10*3/uL (ref 150.0–400.0)
RBC: 4.68 Mil/uL (ref 4.22–5.81)
RDW: 13.1 % (ref 11.5–15.5)
WBC: 4.5 10*3/uL (ref 4.0–10.5)

## 2021-05-23 NOTE — Progress Notes (Signed)
Richard [name] DOB: 20-Nov-1986 Encounter date: 05/23/2021  This is a 35 y.o. male who presents for complete physical   History of present illness/Additional concerns: Last visit was 1 year ago for CPE.   Had some prolonged cough after COVID infection for about a month; did well with inhaler and this subsided.   Has been getting back in running. Left knee has had some pain - has MRI set up for tomorrow morning. Following with Dr. Tamala Julian now.   Dad is in hospital now with gallbladder issues - passing stone, jaundice.   This last week has had harder time falling asleep, but generally energy is good and this is not chronic issue.   Past Medical History:  Diagnosis Date   Stress fracture, right ankle, sequela    Subluxation of extensor carpi ulnaris tendon, right, sequela    Past Surgical History:  Procedure Laterality Date   TONSILLECTOMY AND ADENOIDECTOMY  1996   WISDOM TOOTH EXTRACTION     No Known Allergies Current Meds  Medication Sig   fluticasone (FLONASE) 50 MCG/ACT nasal spray Place into both nostrils daily.   ibuprofen (ADVIL,MOTRIN) 600 MG tablet ibuprofen 600 mg tablet   Multiple Vitamin (MULTIVITAMIN ADULT PO) Take by mouth daily.   Social History   Tobacco Use   Smoking status: Never   Smokeless tobacco: Never  Substance Use Topics   Alcohol use: Yes    Comment: social on weekend   Family History  Problem Relation Age of Onset   High blood pressure Mother    High Cholesterol Mother    Arthritis-Osteo Father    Hearing loss Father    High blood pressure Father    High Cholesterol Father    Asthma Sister    Hearing loss Maternal Grandmother    Heart disease Maternal Grandmother    High blood pressure Maternal Grandmother    High Cholesterol Maternal Grandmother    Stroke Maternal Grandmother 80       TIAs   Heart attack Maternal Grandfather 60   Cancer Paternal Grandmother        ?lung; smoker   Hearing loss Paternal Grandfather    Bladder Cancer  Paternal Uncle    Bladder Cancer Paternal Uncle    Heart Problems Maternal Uncle        ?left ventricular issues   Other Maternal Uncle        cardiac disease     Review of Systems  Constitutional:  Negative for activity change, appetite change, chills, fatigue, fever and unexpected weight change.  HENT:  Negative for congestion, ear pain, hearing loss, sinus pressure, sinus pain, sore throat and trouble swallowing.   Eyes:  Negative for pain and visual disturbance.  Respiratory:  Negative for cough, chest tightness, shortness of breath and wheezing.   Cardiovascular:  Negative for chest pain, palpitations and leg swelling.  Gastrointestinal:  Negative for abdominal distention, abdominal pain, blood in stool, constipation, diarrhea, nausea and vomiting.  Genitourinary:  Negative for decreased urine volume, difficulty urinating, dysuria, penile pain and testicular pain.  Musculoskeletal:  Negative for arthralgias, back pain and joint swelling.  Skin:  Negative for rash.  Neurological:  Negative for dizziness, weakness, numbness and headaches.  Hematological:  Negative for adenopathy. Does not bruise/bleed easily.  Psychiatric/Behavioral:  Negative for agitation, sleep disturbance and suicidal ideas. The patient is not nervous/anxious.    CBC: No results found for: WBC, HGB, HCT, MCH, MCHC, RDW, PLT, MPV CMP: Lab Results  Component Value Date  NA 138 05/27/2020   K 3.9 05/27/2020   CL 100 05/27/2020   CO2 33 (H) 05/27/2020   GLUCOSE 94 05/27/2020   BUN 18 05/27/2020   CREATININE 1.03 05/27/2020   CALCIUM 9.1 05/27/2020   PROT 6.3 05/27/2020   BILITOT 0.8 05/27/2020   ALKPHOS 61 05/27/2020   ALT 14 05/27/2020   AST 15 05/27/2020   LIPID: Lab Results  Component Value Date   CHOL 153 05/27/2020   TRIG 83.0 05/27/2020   HDL 46.70 05/27/2020   LDLCALC 90 05/27/2020    Objective:  BP 118/88 (BP Location: Left Arm, Patient Position: Sitting, Cuff Size: Large)   Pulse (!)  59   Temp 97.8 F (36.6 C) (Oral)   Ht 5' 10.5" (1.791 m)   Wt 219 lb 3.2 oz (99.4 kg)   SpO2 97%   BMI 31.01 kg/m   Weight: 219 lb 3.2 oz (99.4 kg)   BP Readings from Last 3 Encounters:  05/23/21 118/88  05/20/21 122/86  04/03/21 126/88   Wt Readings from Last 3 Encounters:  05/23/21 219 lb 3.2 oz (99.4 kg)  05/20/21 221 lb (100.2 kg)  04/03/21 220 lb (99.8 kg)    Physical Exam Constitutional:      General: He is not in acute distress.    Appearance: He is well-developed.  HENT:     Head: Normocephalic and atraumatic.     Right Ear: External ear normal.     Left Ear: External ear normal.     Nose: Nose normal.     Mouth/Throat:     Pharynx: No oropharyngeal exudate.  Eyes:     Conjunctiva/sclera: Conjunctivae normal.     Pupils: Pupils are equal, round, and reactive to light.  Neck:     Thyroid: No thyromegaly.  Cardiovascular:     Rate and Rhythm: Normal rate and regular rhythm.     Heart sounds: Normal heart sounds. No murmur heard.   No friction rub. No gallop.  Pulmonary:     Effort: Pulmonary effort is normal. No respiratory distress.     Breath sounds: Normal breath sounds. No stridor. No wheezing or rales.  Abdominal:     General: Bowel sounds are normal.     Palpations: Abdomen is soft.  Musculoskeletal:        General: Normal range of motion.     Cervical back: Neck supple.     Comments: Slight edema left knee  Skin:    General: Skin is warm and dry.  Neurological:     Mental Status: He is alert and oriented to person, place, and time.  Psychiatric:        Behavior: Behavior normal.        Thought Content: Thought content normal.        Judgment: Judgment normal.    Assessment/Plan: Health Maintenance Due  Topic Date Due   Pneumococcal Vaccine 65-53 Years old (1 - PCV) Never done   Health Maintenance reviewed - keep up with exercise and healthy eating.  1. Preventative health care We discussed working on keeping up with healthy lifestyle.  He would like to get weight to 200 and feels he can do this with healthier eating, decreasing sweets.  - CBC with Differential/Platelet; Future  2. Lipid screening Well controlled last year. - Lipid panel; Future  3. Screening for diabetes mellitus Getting baseline; may need prior to surgery.  - Comprehensive metabolic panel; Future  Return in about 1 year (around 05/23/2022) for physical exam.  Micheline Rough, MD

## 2021-05-23 NOTE — Addendum Note (Signed)
Addended by: Amanda Cockayne on: 05/23/2021 08:33 AM   Modules accepted: Orders

## 2021-05-24 ENCOUNTER — Other Ambulatory Visit: Payer: Self-pay

## 2021-05-24 ENCOUNTER — Ambulatory Visit (INDEPENDENT_AMBULATORY_CARE_PROVIDER_SITE_OTHER): Payer: 59

## 2021-05-24 DIAGNOSIS — G8929 Other chronic pain: Secondary | ICD-10-CM

## 2021-05-24 DIAGNOSIS — M25562 Pain in left knee: Secondary | ICD-10-CM

## 2021-05-24 IMAGING — MR MR KNEE*L* W/O CM
5 of 8 series · 23 of 40 positions shown · non-contrast
Comparison: None.

CLINICAL DATA: Chronic left knee pain for 4 months. Injured 20
years ago.

EXAM:
MRI OF THE LEFT KNEE WITHOUT CONTRAST
TECHNIQUE: Multiplanar, multisequence MR imaging of the knee was performed. No
intravenous contrast was administered.

[Series 3: T2 fat-sat · axial · 4.0mm · 0.25mm/px · z∈[-71,+24]mm · 3 of 30 slices shown]
[im 1/30]
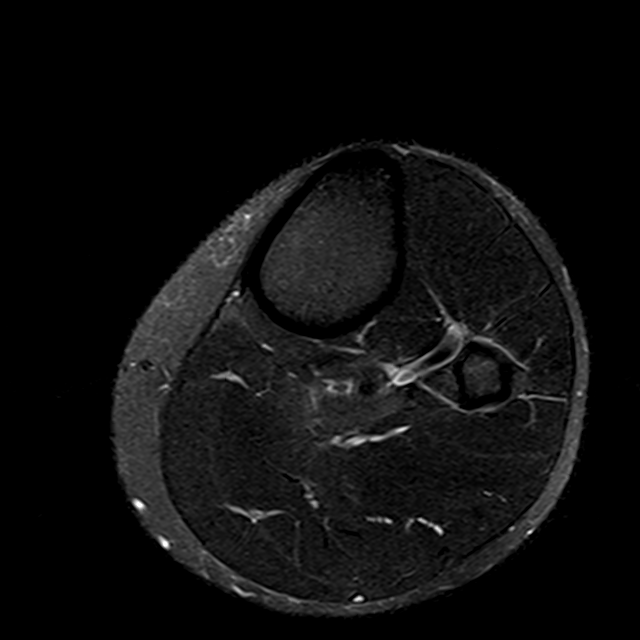
[im 10/30]
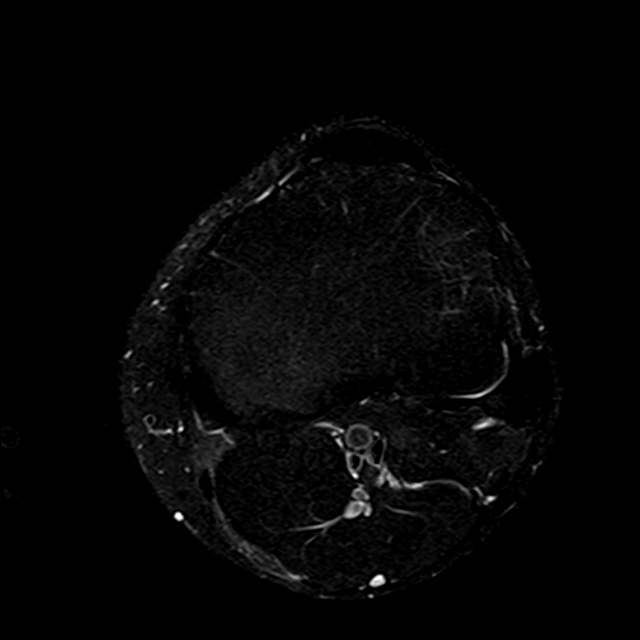
[im 20/30]
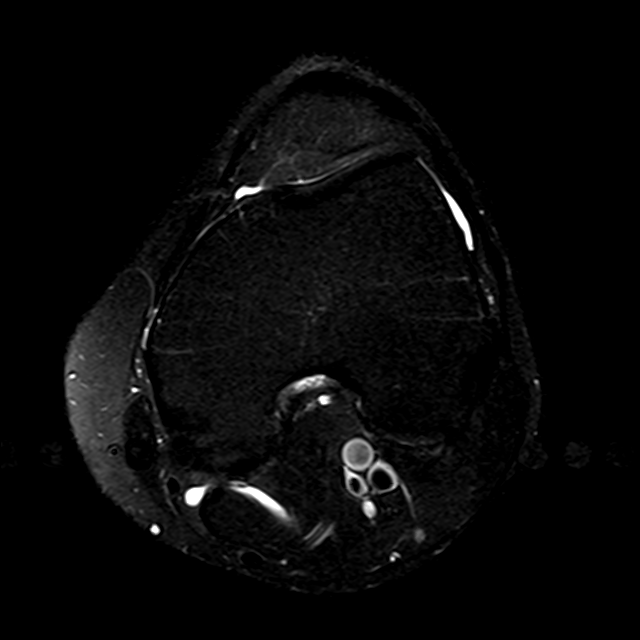

[Series 6: PD fat-sat · coronal · 4.0mm · 0.59mm/px · 5 of 28 slices shown (1 of 4)]
[im 1/28]
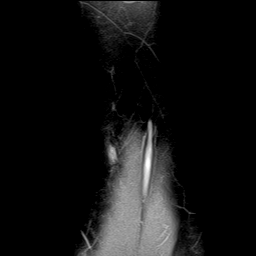
[im 7/28]
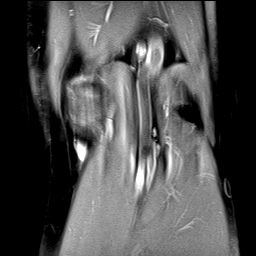
[im 14/28]
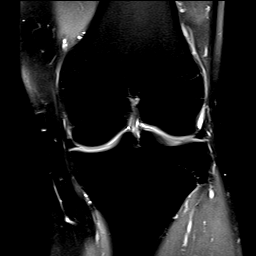
[im 21/28]
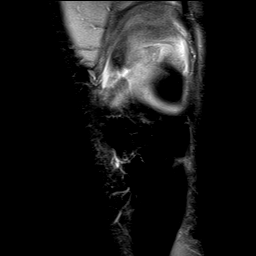
[im 28/28]
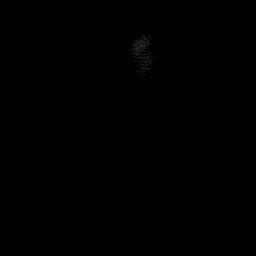

[Series 7: PD fat-sat · sagittal · 3.0mm · 0.29mm/px · 6 of 32 slices shown (2 of 4)]
[im 1/32]
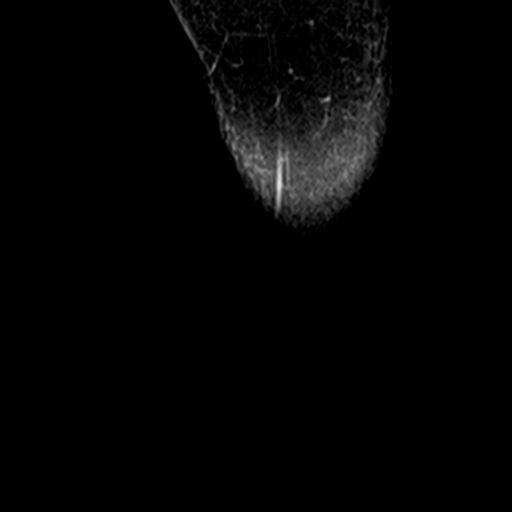
[im 7/32]
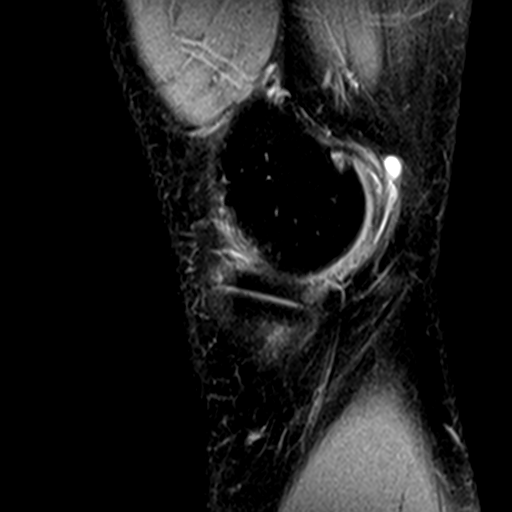
[im 13/32]
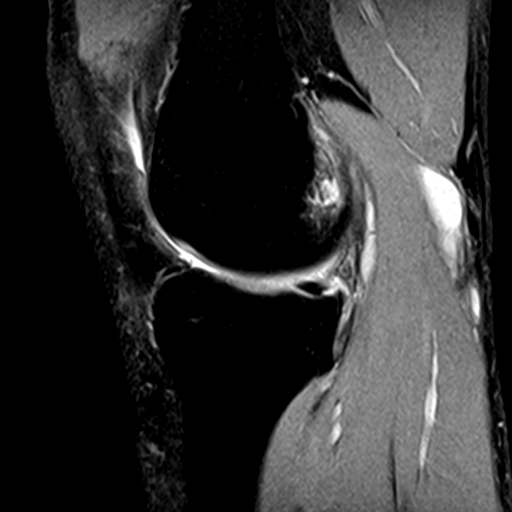
[im 19/32]
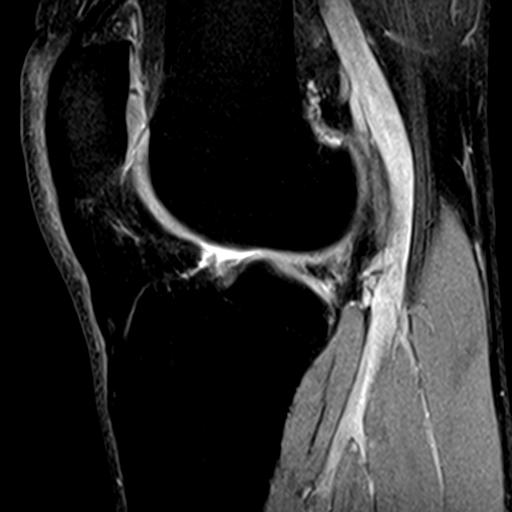
[im 25/32]
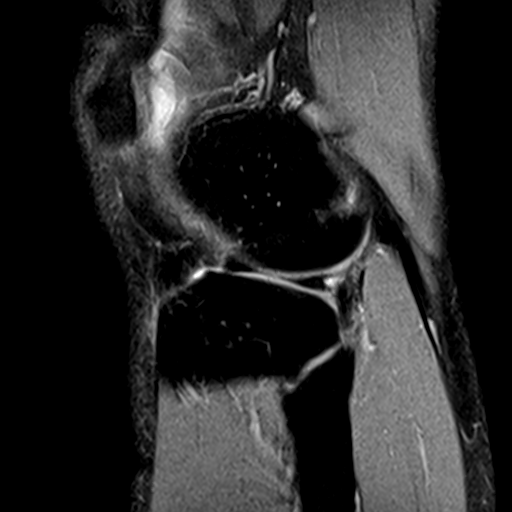
[im 32/32]
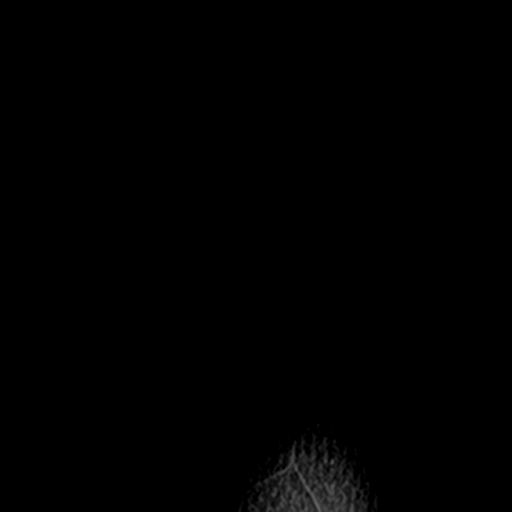

[Series 9: PD fat-sat · oblique · 2.0mm · 0.62mm/px · 4 of 23 slices shown (3 of 4)]
[im 1/23]
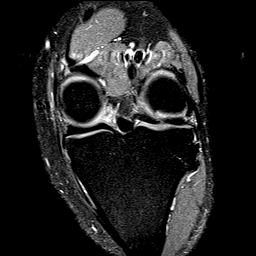
[im 8/23]
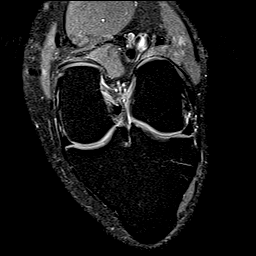
[im 15/23]
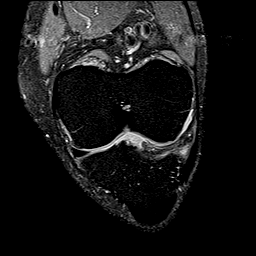
[im 23/23]
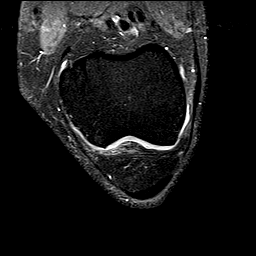

[Series 10: PD fat-sat · coronal · 4.0mm · 0.29mm/px · 5 of 28 slices shown (4 of 4)]
[im 1/28]
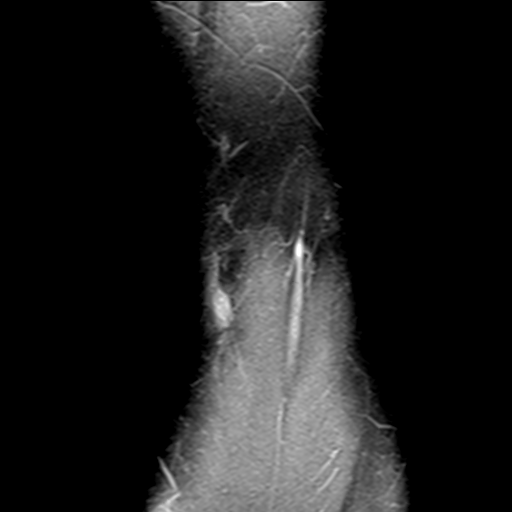
[im 7/28]
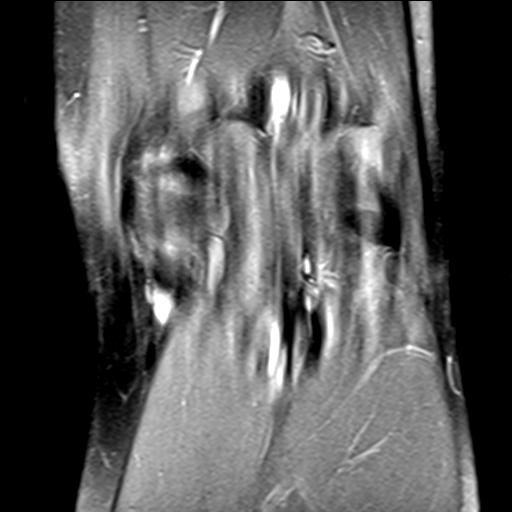
[im 14/28]
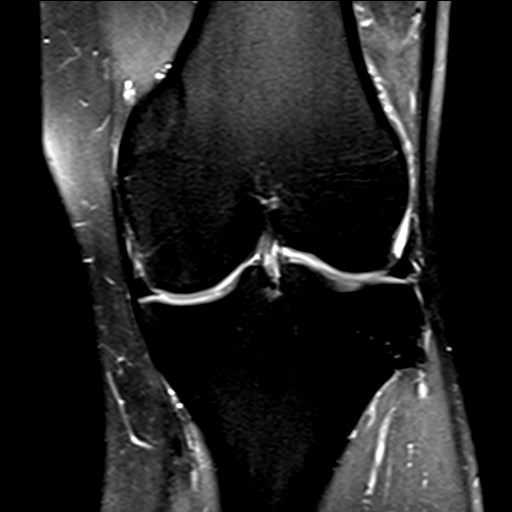
[im 21/28]
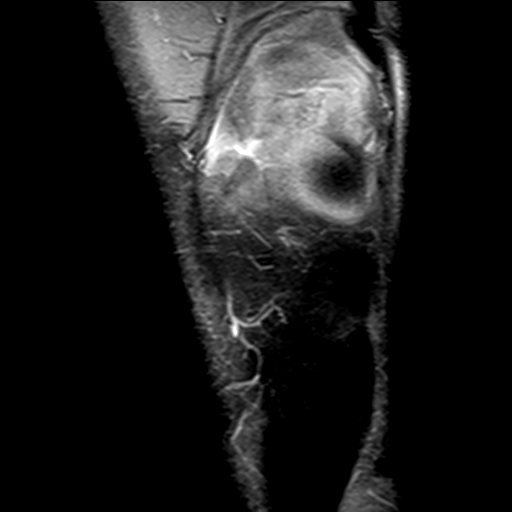
[im 28/28]
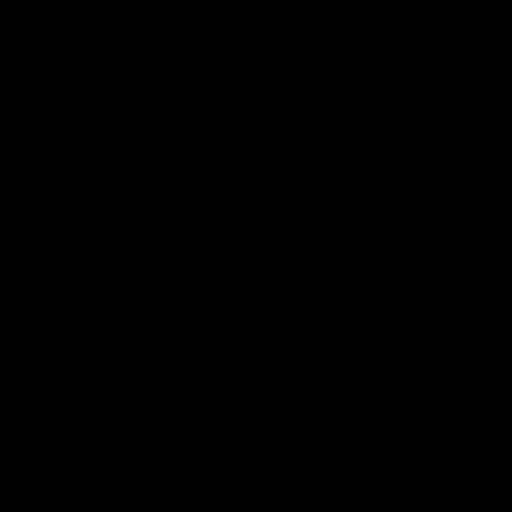

[23 of 40 positions shown; findings below may reference images not displayed]

FINDINGS: MENISCI

Medial: Intact.

Lateral: Tiny radial tear of the free edge of the posterior horn of
the lateral meniscus.

LIGAMENTS

Cruciates: ACL and PCL are intact.

Collaterals: Medial collateral ligament is intact. Lateral
collateral ligament complex is intact.

CARTILAGE

Patellofemoral:  No chondral defect.

Medial: Mild partial-thickness cartilage loss of the medial
femorotibial compartment.

Lateral: Partial thickness cartilage loss of the lateral
femorotibial compartment with a small area of full-thickness
cartilage loss of the posterior weight-bearing surface measuring 3 x
8 mm with mild subchondral reactive marrow changes.

JOINT: No joint effusion. Normal CARDENAD. No plical
thickening.

POPLITEAL FOSSA: Popliteus tendon is intact. Small Baker's cyst.

EXTENSOR MECHANISM: Intact quadriceps tendon. Intact patellar
tendon. Intact lateral patellar retinaculum. Intact medial patellar
retinaculum. Intact MPFL.

BONES: No aggressive osseous lesion. No fracture or dislocation.
Ossified NOF of the medial femoral condyle.

Other: No fluid collection or hematoma. Muscles are normal.
IMPRESSION: 1. Tiny radial tear of the free edge of the posterior horn of the
lateral meniscus.
2. Partial thickness cartilage loss of the lateral femorotibial
compartment with a small area of full-thickness cartilage loss of
the posterior weight-bearing surface measuring 3 x 8 mm with mild
subchondral reactive marrow changes.
3. Mild partial-thickness cartilage loss of the medial femorotibial
compartment.
4. Small Baker's cyst.

## 2021-05-26 ENCOUNTER — Encounter: Payer: Self-pay | Admitting: Family Medicine

## 2021-05-28 ENCOUNTER — Encounter: Payer: Self-pay | Admitting: Family Medicine

## 2021-05-28 ENCOUNTER — Other Ambulatory Visit: Payer: Self-pay

## 2021-05-28 ENCOUNTER — Ambulatory Visit (INDEPENDENT_AMBULATORY_CARE_PROVIDER_SITE_OTHER): Payer: 59 | Admitting: Family Medicine

## 2021-05-28 DIAGNOSIS — M1712 Unilateral primary osteoarthritis, left knee: Secondary | ICD-10-CM | POA: Diagnosis not present

## 2021-05-28 NOTE — Progress Notes (Signed)
[facility] [address] [place] Phone: [phone] Subjective:   I [name] am serving as a Education administrator for Dr. Hulan Saas.  This visit occurred during the SARS-CoV-2 public health emergency.  Safety protocols were in place, including screening questions prior to the visit, additional usage of staff PPE, and extensive cleaning of exam room while observing appropriate contact time as indicated for disinfecting solutions.   I'm seeing this patient by the request  of:  Caren Macadam, MD  CC: Knee pain follow-up  [id number]  05/20/2021 Patient had aspiration of the knee previously but now does have more increased swelling noted of the patellofemoral joint as well and continues to have what appears to be symptoms consistent with a possible medial meniscal tear.  Patient has failed all conservative therapy including greater than 3 months of therapy, home exercises, as well as 1 injection.  I do feel at this point and advanced imaging is warranted.  Patient could be a candidate for potential injections or we will consider the possibility of surgical intervention if needed.  We will await imaging.  05/28/2021 GARRELL FLAGG is a 35 y.o. male coming in with complaint of left knee pain. States it is a little sore. Decreased activity and wearing knee brace and states this is helping.  Patient had failed all other conservative therapy and did have an MRI.  MRI which was independently visualized by me today of the left knee shows patient did have partial thickness loss of the lateral femoral-tibial compartment where there was focal areas of near full-thickness cartilage loss with subchondral edema and a very small Baker cyst.     Past Medical History:  Diagnosis Date   Stress fracture, right ankle, sequela    Subluxation of extensor carpi ulnaris tendon, right, sequela    Past Surgical History:  Procedure Laterality Date    TONSILLECTOMY AND ADENOIDECTOMY  1996   WISDOM TOOTH EXTRACTION     Social History   Socioeconomic History   Marital status: Married    Spouse name: Not on file   Number of children: Not on file   Years of education: Not on file   Highest education level: Not on file  Occupational History   Not on file  Tobacco Use   Smoking status: Never   Smokeless tobacco: Never  Substance and Sexual Activity   Alcohol use: Yes    Comment: social on weekend   Drug use: Never   Sexual activity: Yes    Partners: Female  Other Topics Concern   Not on file  Social History Narrative   Not on file   Social Determinants of Health   Financial Resource Strain: Not on file  Food Insecurity: Not on file  Transportation Needs: Not on file  Physical Activity: Not on file  Stress: Not on file  Social Connections: Not on file   No Known Allergies Family History  Problem Relation Age of Onset   High blood pressure Mother    High Cholesterol Mother    Arthritis-Osteo Father    Hearing loss Father    High blood pressure Father    High Cholesterol Father    Asthma Sister    Hearing loss Maternal Grandmother    Heart disease Maternal Grandmother    High blood pressure Maternal Grandmother    High Cholesterol Maternal Grandmother    Stroke Maternal Grandmother 80       TIAs   Heart attack Maternal  Grandfather 71   Cancer Paternal Grandmother        ?lung; smoker   Hearing loss Paternal Grandfather    Bladder Cancer Paternal Uncle    Bladder Cancer Paternal Uncle    Heart Problems Maternal Uncle        ?left ventricular issues   Other Maternal Uncle        cardiac disease      Current Outpatient Medications (Respiratory):    fluticasone (FLONASE) 50 MCG/ACT nasal spray, Place into both nostrils daily.  Current Outpatient Medications (Analgesics):    ibuprofen (ADVIL,MOTRIN) 600 MG tablet, ibuprofen 600 mg tablet   Current Outpatient Medications (Other):    Multiple Vitamin  (MULTIVITAMIN ADULT PO), Take by mouth daily.   Reviewed prior external information including notes and imaging from  primary care provider As well as notes that were available from care everywhere and other healthcare systems.  Past medical history, social, surgical and family history all reviewed in electronic medical record.  No pertanent information unless stated regarding to the chief complaint.   Review of Systems:  No headache, visual changes, nausea, vomiting, diarrhea, constipation, dizziness, abdominal pain, skin rash, fevers, chills, night sweats, weight loss, swollen lymph nodes, body aches, joint swelling, chest pain, shortness of breath, mood changes. POSITIVE muscle aches  Objective  Blood pressure (!) 142/90, pulse 68, height 5' 10.5" (1.791 m), weight 221 lb (100.2 kg), SpO2 98 %.   General: No apparent distress alert and oriented x3 mood and affect normal, dressed appropriately.  HEENT: Pupils equal, extraocular movements intact  Respiratory: Patient's speak in full sentences and does not appear short of breath  Cardiovascular: No lower extremity edema, non tender, no erythema  Gait normal with good balance and coordination.  MSK: Left knee exam still has some mild tenderness of the medial and lateral joint space.  Patient has fairly good stability.  Near full range of motion today but does have a trace effusion.  After informed written and verbal consent, patient was seated on exam table. Left knee was prepped with alcohol swab and utilizing anterolateral approach, patient's left knee space was injected with 40 mg per 3 mL of Monovisc (sodium hyaluronate) in a prefilled syringe was injected easily into the knee through a 22-gauge needle..Patient tolerated the procedure well without immediate complications.    Impression and Recommendations:     The above documentation has been reviewed and is accurate and complete Lyndal Pulley, DO

## 2021-05-28 NOTE — Patient Instructions (Addendum)
Good to see you Injection monovisc today Can ice but hold on exercises until Monday Ok to walk this weekend Ok to try tennis in 3 weeks I think the stationary bike is a good idea See me again in 6 weeks

## 2021-05-29 ENCOUNTER — Encounter: Payer: Self-pay | Admitting: Family Medicine

## 2021-05-29 DIAGNOSIS — M1712 Unilateral primary osteoarthritis, left knee: Secondary | ICD-10-CM | POA: Insufficient documentation

## 2021-05-29 NOTE — Assessment & Plan Note (Signed)
Patient had injection today with viscosupplementation.  Plan highly optimistic that this should do relatively well.  Can return to sports progression given to work on certain low impact exercises for now but then increasing to tennis and the other sports including Trail walking and running over the course of the next months.  Patient will see me again in 6 to 8 weeks to see how he is doing otherwise.  Can use ibuprofen for breakthrough.  Warned patient that if any abnormality since the shot to call us immediately or seek medical attention.

## 2021-07-10 NOTE — Progress Notes (Signed)
[place] Phone: [phone] Subjective:   Fontaine No, am serving as a scribe for Dr. Hulan Saas.  This visit occurred during the SARS-CoV-2 public health emergency.  Safety protocols were in place, including screening questions prior to the visit, additional usage of staff PPE, and extensive cleaning of exam room while observing appropriate contact time as indicated for disinfecting solutions.    I'm seeing this patient by the request  of:  Caren Macadam, MD  CC: Left knee pain  [id number]  05/28/2021 Patient had injection today with viscosupplementation.  Plan highly optimistic that this should do relatively well.  Can return to sports progression given to work on certain low impact exercises for now but then increasing to tennis and the other sports including Trail walking and running over the course of the next months.  Patient will see me again in 6 to 8 weeks to see how he is doing otherwise.  Can use ibuprofen for breakthrough.  Warned patient that if any abnormality since the shot to call us immediately or seek medical attention.  Update 07/11/2021 Richard [name] is a 35 y.o. male coming in with complaint of L knee pain. Patient states that he is having tightness in anterior knee. Patient has tried running and playing tennis. Soreness afterwards. Braces with activity. Patient felt like gel injection did help at first but is now having grinding and popping.   Patient did have the MRI in June of this year.  MRI of the knee did show a tiny radial free edge tear of the posterior horn of the lateral meniscus.  I believe that there is one of the medial meniscus we did review it as well.  Patient also has partial thickness loss of the lateral femoral-tibial compartment.  Past Medical History:  Diagnosis Date   Stress fracture, right ankle, sequela    Subluxation of extensor carpi ulnaris tendon,  right, sequela    Past Surgical History:  Procedure Laterality Date   TONSILLECTOMY AND ADENOIDECTOMY  1996   WISDOM TOOTH EXTRACTION     Social History   Socioeconomic History   Marital status: Married    Spouse name: Not on file   Number of children: Not on file   Years of education: Not on file   Highest education level: Not on file  Occupational History   Not on file  Tobacco Use   Smoking status: Never   Smokeless tobacco: Never  Substance and Sexual Activity   Alcohol use: Yes    Comment: social on weekend   Drug use: Never   Sexual activity: Yes    Partners: Female  Other Topics Concern   Not on file  Social History Narrative   Not on file   Social Determinants of Health   Financial Resource Strain: Not on file  Food Insecurity: Not on file  Transportation Needs: Not on file  Physical Activity: Not on file  Stress: Not on file  Social Connections: Not on file   No Known Allergies Family History  Problem Relation Age of Onset   High blood pressure Mother    High Cholesterol Mother    Arthritis-Osteo Father    Hearing loss Father    High blood pressure Father    High Cholesterol Father    Asthma Sister    Hearing loss Maternal Grandmother    Heart disease Maternal Grandmother    High blood pressure Maternal Grandmother  High Cholesterol Maternal Grandmother    Stroke Maternal Grandmother 80       TIAs   Heart attack Maternal Grandfather 36   Cancer Paternal Grandmother        ?lung; smoker   Hearing loss Paternal Grandfather    Bladder Cancer Paternal Uncle    Bladder Cancer Paternal Uncle    Heart Problems Maternal Uncle        ?left ventricular issues   Other Maternal Uncle        cardiac disease      Current Outpatient Medications (Respiratory):    fluticasone (FLONASE) 50 MCG/ACT nasal spray, Place into both nostrils daily.  Current Outpatient Medications (Analgesics):    ibuprofen (ADVIL,MOTRIN) 600 MG tablet, ibuprofen 600 mg  tablet   meloxicam (MOBIC) 15 MG tablet, Take 1 tablet (15 mg total) by mouth daily.   Current Outpatient Medications (Other):    Multiple Vitamin (MULTIVITAMIN ADULT PO), Take by mouth daily.   Reviewed prior external information including notes and imaging from  primary care provider As well as notes that were available from care everywhere and other healthcare systems.  Past medical history, social, surgical and family history all reviewed in electronic medical record.  No pertanent information unless stated regarding to the chief complaint.   Review of Systems:  No headache, visual changes, nausea, vomiting, diarrhea, constipation, dizziness, abdominal pain, skin rash, fevers, chills, night sweats, weight loss, swollen lymph nodes, body aches, joint swelling, chest pain, shortness of breath, mood changes. POSITIVE muscle aches  Objective  Blood pressure 140/90, pulse 89, height 5' 10.5" (1.791 m), weight 222 lb (100.7 kg), SpO2 97 %.   General: No apparent distress alert and oriented x3 mood and affect normal, dressed appropriately.  HEENT: Pupils equal, extraocular movements intact  Respiratory: Patient's speak in full sentences and does not appear short of breath  Cardiovascular: No lower extremity edema, non tender, no erythema  Gait normal with good balance and coordination.  MSK: Left knee exam shows the patient does have tenderness to palpation minorly over the anterior aspect of the knee as well as over the medial aspect.  Positive McMurray's sign medially and laterally.  Positive Thessaly sign noted.  Does have near full range of motion but maybe lacks last 2 degrees of extension.  'Limited muscular skeletal ultrasound was performed and interpreted by Hulan Saas, M  Limited ultrasound of patient's left knee shows the patient does have what appears to be a mildly displaced meniscal tear noted of the posterior aspect.  Hypoechoic changes in this area.  No significant  reaccumulation of the Baker's cyst.  Trace effusion noted of the patellofemoral joint.     Impression and Recommendations:     The above documentation has been reviewed and is accurate and complete Lyndal Pulley, DO

## 2021-07-11 ENCOUNTER — Ambulatory Visit: Payer: Self-pay

## 2021-07-11 ENCOUNTER — Ambulatory Visit: Payer: 59 | Admitting: Family Medicine

## 2021-07-11 ENCOUNTER — Other Ambulatory Visit (HOSPITAL_BASED_OUTPATIENT_CLINIC_OR_DEPARTMENT_OTHER): Payer: Self-pay

## 2021-07-11 ENCOUNTER — Other Ambulatory Visit: Payer: Self-pay

## 2021-07-11 VITALS — BP 140/90 | HR 89 | Ht 70.5 in | Wt 222.0 lb

## 2021-07-11 DIAGNOSIS — S83242A Other tear of medial meniscus, current injury, left knee, initial encounter: Secondary | ICD-10-CM | POA: Diagnosis not present

## 2021-07-11 DIAGNOSIS — G8929 Other chronic pain: Secondary | ICD-10-CM

## 2021-07-11 DIAGNOSIS — M25562 Pain in left knee: Secondary | ICD-10-CM

## 2021-07-11 MED ORDER — MELOXICAM 15 MG PO TABS
15.0000 mg | ORAL_TABLET | Freq: Every day | ORAL | 0 refills | Status: DC
  Filled 2021-07-11: qty 30, 30d supply, fill #0

## 2021-07-11 NOTE — Assessment & Plan Note (Signed)
Patient still has what appears to be more of the findings that is consistent with a meniscal tear.  On ultrasound today it does seem to have further displacement noted.  Patient's MRI did only show a tiny free edge tear but I do believe that this may have progressed over the course of time now.  Patient has failed all conservative therapy including aspiration of the Baker's cyst, viscosupplementation, home exercises, as well as avoiding certain activities.  I do believe that further evaluation with orthopedic surgery to discuss the possibility of arthroscopic procedure would be beneficial.  Patient given meloxicam for breakthrough pain.  Discussed this with patient as well as his wife today.  They can write me with any other questions.

## 2021-07-11 NOTE — Patient Instructions (Addendum)
Dr. Rivka Barbara Read about PRP just in case Ok to stay active avoid twisting though Send me a message Meloxicam '15mg'$  daily for 10 days then as needed

## 2021-07-16 ENCOUNTER — Encounter: Payer: Self-pay | Admitting: Family Medicine

## 2021-07-17 ENCOUNTER — Encounter: Payer: Self-pay | Admitting: Family Medicine

## 2021-07-17 ENCOUNTER — Other Ambulatory Visit: Payer: Self-pay

## 2021-07-17 ENCOUNTER — Other Ambulatory Visit (HOSPITAL_BASED_OUTPATIENT_CLINIC_OR_DEPARTMENT_OTHER): Payer: Self-pay

## 2021-07-17 ENCOUNTER — Encounter: Payer: Self-pay | Admitting: Orthopaedic Surgery

## 2021-07-17 ENCOUNTER — Ambulatory Visit: Payer: 59 | Admitting: Orthopaedic Surgery

## 2021-07-17 VITALS — Ht 71.0 in | Wt 216.0 lb

## 2021-07-17 DIAGNOSIS — M1712 Unilateral primary osteoarthritis, left knee: Secondary | ICD-10-CM

## 2021-07-17 DIAGNOSIS — S83282A Other tear of lateral meniscus, current injury, left knee, initial encounter: Secondary | ICD-10-CM | POA: Diagnosis not present

## 2021-07-17 MED ORDER — DICLOFENAC SODIUM 2 % EX SOLN: 2.0000 g | Freq: Two times a day (BID) | CUTANEOUS | 3 refills | Status: DC | PRN

## 2021-07-17 MED ORDER — CELECOXIB 200 MG PO CAPS
200.0000 mg | ORAL_CAPSULE | Freq: Two times a day (BID) | ORAL | 3 refills | Status: DC
  Filled 2021-07-17: qty 30, 15d supply, fill #0
  Filled 2021-07-29: qty 30, 15d supply, fill #1
  Filled 2021-08-21: qty 30, 15d supply, fill #2

## 2021-07-17 NOTE — Progress Notes (Signed)
Office Visit Note   Patient: Richard [name]           Date of Birth: Jan 31, 1986           MRN: [id number] Visit Date: 07/17/2021              Requested by: Lyndal Pulley, DO [place],  [place] [address] PCP: Caren Macadam, MD   Assessment & Plan: Visit Diagnoses:  1. Primary osteoarthritis of left knee   2. 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  He also mentioned that trial of Pennsaid did provide him with significant relief.  I looked at the meniscus tear on the MRI which does not look too impressive and would not explain his constellation of symptoms.  We did have a detailed discussion on other treatment options to include cortisone injection, Celebrex, Pennsaid, formal outpatient physical therapy to evaluate gait, and weight loss as his BMI is 30.  Based on our discussion we will hold off on an injection today and try the other measures for at least a month with proper rest.  Questions encouraged and answered.  Follow-Up Instructions: Return if symptoms worsen or fail to improve.   Orders:  No orders of the defined types were placed in this encounter.  Meds ordered this encounter  Medications   Diclofenac Sodium (PENNSAID) 2 % SOLN    Sig: Apply 2 g topically 2 (two) times daily as needed (to affected area).    Dispense:  112 g    Refill:  3   celecoxib (CELEBREX) 200 MG capsule    Sig: Take 1 capsule (200 mg total) by mouth 2 (two) times daily.    Dispense:  30 capsule    Refill:  3      Procedures: No procedures performed   Clinical Data: No additional findings.   Subjective: Chief Complaint  Patient presents with   Left Knee - Pain    Richard [name] is a 35 year old gentleman who comes in see me for chronic left knee pain for about 6 months.  He  is a referral from Dr. Hulan Saas.  He describes grinding and popping and increased pain in swelling sometimes during activity and usually after activity.  He does not describe any true mechanical symptoms.  He feels throbbing and aching at nighttime that sometimes prevents him from sleeping.  He had a Monovisc injection about 6 weeks ago which provided him with about a few weeks of relief.  Meloxicam gives marginal relief.  He has not had any cortisone injections.  He does occasionally wear a knee brace during activity.  He has not been able to do formal physical therapy or the prescribed home exercise program.  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   Review of Systems  Constitutional: Negative.   All other systems reviewed and are negative.   Objective: Vital Signs: Ht '5\' 11"'$  (1.803 m)   Wt 216 lb (98 kg)   BMI 30.13 kg/m   Physical Exam Vitals and nursing note reviewed.  Constitutional:      Appearance: He is well-developed.  Pulmonary:     Effort: Pulmonary  effort is normal.  Abdominal:     Palpations: Abdomen is soft.  Skin:    General: Skin is warm.  Neurological:     Mental Status: He is alert and oriented to person, place, and time.  Psychiatric:        Behavior: Behavior normal.        Thought Content: Thought content normal.        Judgment: Judgment normal.    Ortho Exam Left knee shows no effusion.  He has excellent range of motion without any guarding.  Collaterals and cruciates are stable.  No joint line tenderness.  Negative McMurray.  Mild patellofemoral crepitus.  Normal patellar tracking and negative patellar apprehension.  No bony tenderness. Specialty Comments:  No specialty comments available.  Imaging: No results found.   PMFS History: Patient Active Problem List   Diagnosis Date Noted   Acute lateral meniscus tear of left  knee 07/17/2021   Degenerative arthritis of left knee 05/29/2021   Acute medial meniscus tear, left, initial encounter 03/05/2021   Baker's cyst of knee, left 03/05/2021   ECU (extensor carpi ulnaris), subluxation/dislocation, right, initial encounter 12/11/2016   Past Medical History:  Diagnosis Date   Stress fracture, right ankle, sequela    Subluxation of extensor carpi ulnaris tendon, right, sequela     Family History  Problem Relation Age of Onset   High blood pressure Mother    High Cholesterol Mother    Arthritis-Osteo Father    Hearing loss Father    High blood pressure Father    High Cholesterol Father    Asthma Sister    Hearing loss Maternal Grandmother    Heart disease Maternal Grandmother    High blood pressure Maternal Grandmother    High Cholesterol Maternal Grandmother    Stroke Maternal Grandmother 80       TIAs   Heart attack Maternal Grandfather 7   Cancer Paternal Grandmother        ?lung; smoker   Hearing loss Paternal Grandfather    Bladder Cancer Paternal Uncle    Bladder Cancer Paternal Uncle    Heart Problems Maternal Uncle        ?left ventricular issues   Other Maternal Uncle        cardiac disease    Past Surgical History:  Procedure Laterality Date   TONSILLECTOMY AND ADENOIDECTOMY  1996   WISDOM TOOTH EXTRACTION     Social History   Occupational History   Not on file  Tobacco Use   Smoking status: Never   Smokeless tobacco: Never  Substance and Sexual Activity   Alcohol use: Yes    Comment: social on weekend   Drug use: Never   Sexual activity: Yes    Partners: Female

## 2021-07-21 ENCOUNTER — Other Ambulatory Visit: Payer: Self-pay

## 2021-07-21 ENCOUNTER — Telehealth: Payer: Self-pay | Admitting: Family Medicine

## 2021-07-21 DIAGNOSIS — M1712 Unilateral primary osteoarthritis, left knee: Secondary | ICD-10-CM

## 2021-07-21 DIAGNOSIS — S83242A Other tear of medial meniscus, current injury, left knee, initial encounter: Secondary | ICD-10-CM

## 2021-07-21 NOTE — Telephone Encounter (Signed)
Referral placed and patient notified.  

## 2021-07-21 NOTE — Progress Notes (Unsigned)
Amb refer

## 2021-07-21 NOTE — Telephone Encounter (Signed)
Pt would like Dr. Tamala Julian to call him to discuss his recent referral to Dr. Erlinda Hong. He does not want to send a MyChart message nor give me the specifics of his concerns as he does not want them documented. I advised I would sent a note requesting a call.

## 2021-07-22 ENCOUNTER — Telehealth: Payer: Self-pay | Admitting: Orthopaedic Surgery

## 2021-07-22 NOTE — Telephone Encounter (Signed)
Pt pharmacy called and states she is needing prior auth for a medication.  If we have cover my meds key # : [id number] If not you will have to call pt insurance. [place] [phone]

## 2021-07-23 DIAGNOSIS — M25562 Pain in left knee: Secondary | ICD-10-CM | POA: Diagnosis not present

## 2021-07-23 NOTE — Telephone Encounter (Signed)
PA done through [email or web address]    Richard [name] (Key: [id number]) - [id number] Pennsaid 2% solution     Status: PA Request  Created: August 17th, 2022  Sent: August 17th, 2022  Open  Archive   PENDING APPROVAL.  Also, advised patient if PA was denied that he can just get Voltaren gel OTC.

## 2021-07-23 NOTE — Telephone Encounter (Signed)
[employer] is reviewing your PA request. You may close this dialog, return to your dashboard, and perform other tasks.  To check for an update later, open this request again from your dashboard. If [employer] has not replied within 24 hours for urgent requests or within 48 hours for standard requests, please contact [employer] at [phone].

## 2021-07-25 ENCOUNTER — Encounter: Payer: Self-pay | Admitting: Family Medicine

## 2021-07-29 ENCOUNTER — Other Ambulatory Visit (HOSPITAL_BASED_OUTPATIENT_CLINIC_OR_DEPARTMENT_OTHER): Payer: Self-pay

## 2021-07-29 MED ORDER — COVID-19 AT HOME ANTIGEN TEST VI KIT
PACK | 0 refills | Status: DC
  Filled 2021-07-29: qty 2, 4d supply, fill #0

## 2021-08-21 ENCOUNTER — Other Ambulatory Visit (HOSPITAL_BASED_OUTPATIENT_CLINIC_OR_DEPARTMENT_OTHER): Payer: Self-pay

## 2021-08-21 MED ORDER — COVID-19 AT HOME ANTIGEN TEST VI KIT
PACK | 0 refills | Status: DC
  Filled 2021-08-21: qty 2, 4d supply, fill #0

## 2021-09-10 NOTE — Progress Notes (Signed)
[place] Phone: [phone] Subjective:   Fontaine No, am serving as a scribe for Dr. Hulan Saas. This visit occurred during the SARS-CoV-2 public health emergency.  Safety protocols were in place, including screening questions prior to the visit, additional usage of staff PPE, and extensive cleaning of exam room while observing appropriate contact time as indicated for disinfecting solutions.   I'm seeing this patient by the request  of:  Caren Macadam, MD  CC: Left knee pain  [id number]  07/11/2021 Patient still has what appears to be more of the findings that is consistent with a meniscal tear.  On ultrasound today it does seem to have further displacement noted.  Patient's MRI did only show a tiny free edge tear but I do believe that this may have progressed over the course of time now.  Patient has failed all conservative therapy including aspiration of the Baker's cyst, viscosupplementation, home exercises, as well as avoiding certain activities.  I do believe that further evaluation with orthopedic surgery to discuss the possibility of arthroscopic procedure would be beneficial.  Patient given meloxicam for breakthrough pain.  Discussed this with patient as well as his wife today.  They can write me with any other questions.  Update 09/11/2021 Richard [name] is a 35 y.o. male coming in with complaint of L knee pain. Patient saw Dr. Denice Bors who does not recommend surgery at this time. Patient taking Celebrex and performing HEP. Patient states that 1 week ago he stopped taking Celebrex as he has been doing much better. Feels less grinding. Patient is able to walk and play pickleball without pain.   Patient did have an MRI done in June 2022 showing the patient did have a radial tear of the free edge of the posterior horn of the lateral meniscus and partial thickness cartilage loss in multiple different  areas of the patellofemoral joint.  Patient on ultrasound follow-up seem to have increased displacement noted.     Past Medical History:  Diagnosis Date   Stress fracture, right ankle, sequela    Subluxation of extensor carpi ulnaris tendon, right, sequela    Past Surgical History:  Procedure Laterality Date   TONSILLECTOMY AND ADENOIDECTOMY  1996   WISDOM TOOTH EXTRACTION     Social History   Socioeconomic History   Marital status: Married    Spouse name: Not on file   Number of children: Not on file   Years of education: Not on file   Highest education level: Not on file  Occupational History   Not on file  Tobacco Use   Smoking status: Never   Smokeless tobacco: Never  Substance and Sexual Activity   Alcohol use: Yes    Comment: social on weekend   Drug use: Never   Sexual activity: Yes    Partners: Female  Other Topics Concern   Not on file  Social History Narrative   Not on file   Social Determinants of Health   Financial Resource Strain: Not on file  Food Insecurity: Not on file  Transportation Needs: Not on file  Physical Activity: Not on file  Stress: Not on file  Social Connections: Not on file   No Known Allergies Family History  Problem Relation Age of Onset   High blood pressure Mother    High Cholesterol Mother    Arthritis-Osteo Father    Hearing loss Father    High blood pressure Father  High Cholesterol Father    Asthma Sister    Hearing loss Maternal Grandmother    Heart disease Maternal Grandmother    High blood pressure Maternal Grandmother    High Cholesterol Maternal Grandmother    Stroke Maternal Grandmother 80       TIAs   Heart attack Maternal Grandfather 12   Cancer Paternal Grandmother        ?lung; smoker   Hearing loss Paternal Grandfather    Bladder Cancer Paternal Uncle    Bladder Cancer Paternal Uncle    Heart Problems Maternal Uncle        ?left ventricular issues   Other Maternal Uncle        cardiac disease       Current Outpatient Medications (Respiratory):    fluticasone (FLONASE) 50 MCG/ACT nasal spray, Place into both nostrils daily.  Current Outpatient Medications (Analgesics):    celecoxib (CELEBREX) 200 MG capsule, Take 1 capsule (200 mg total) by mouth 2 (two) times daily.   ibuprofen (ADVIL,MOTRIN) 600 MG tablet, ibuprofen 600 mg tablet   Current Outpatient Medications (Other):    COVID-19 At Home Antigen Test KIT, Use as directed.   Diclofenac Sodium (PENNSAID) 2 % SOLN, Apply 2 g topically 2 (two) times daily as needed (to affected area).   Multiple Vitamin (MULTIVITAMIN ADULT PO), Take by mouth daily.   Objective  Blood pressure 118/78, pulse 83, height _0  (1.803 m), weight 223 lb (101.2 kg), SpO2 (!) 8 %.   General: No apparent distress alert and oriented x3 mood and affect normal, dressed appropriately.  HEENT: Pupils equal, extraocular movements intact  Respiratory: Patient's speak in full sentences and does not appear short of breath  Cardiovascular: No lower extremity edema, non tender, no erythema  Gait normal with good balance and coordination.  MSK: Left knee exam shows very minimal tenderness over the medial joint line but does have good range of motion.  Negative McMurray's noted.  No significant swelling noted.  Limited muscular skeletal ultrasound was performed and interpreted by Hulan Saas, M  Limited ultrasound shows the patient does have still a radial tear noted of the medial meniscus but no significant displacement.  No significant accumulation of the Baker's cyst noted.  No effusion of the patellofemoral joint. Impression: Interval improvement    Impression and Recommendations:     The above documentation has been reviewed and is accurate and complete Lyndal Pulley, DO

## 2021-09-11 ENCOUNTER — Encounter: Payer: Self-pay | Admitting: Family Medicine

## 2021-09-11 ENCOUNTER — Ambulatory Visit: Payer: 59 | Admitting: Family Medicine

## 2021-09-11 ENCOUNTER — Other Ambulatory Visit: Payer: Self-pay

## 2021-09-11 ENCOUNTER — Other Ambulatory Visit (HOSPITAL_BASED_OUTPATIENT_CLINIC_OR_DEPARTMENT_OTHER): Payer: Self-pay

## 2021-09-11 ENCOUNTER — Ambulatory Visit: Payer: Self-pay

## 2021-09-11 VITALS — BP 118/78 | HR 83 | Ht 71.0 in | Wt 223.0 lb

## 2021-09-11 DIAGNOSIS — S83242A Other tear of medial meniscus, current injury, left knee, initial encounter: Secondary | ICD-10-CM

## 2021-09-11 DIAGNOSIS — G8929 Other chronic pain: Secondary | ICD-10-CM | POA: Diagnosis not present

## 2021-09-11 DIAGNOSIS — M25562 Pain in left knee: Secondary | ICD-10-CM

## 2021-09-11 MED ORDER — CELECOXIB 200 MG PO CAPS
200.0000 mg | ORAL_CAPSULE | Freq: Two times a day (BID) | ORAL | 0 refills | Status: DC
  Filled 2021-09-11: qty 180, 90d supply, fill #0

## 2021-09-11 MED ORDER — INFLUENZA VAC SPLIT QUAD 0.5 ML IM SUSY
PREFILLED_SYRINGE | INTRAMUSCULAR | 0 refills | Status: DC
  Filled 2021-09-11: qty 0.5, 1d supply, fill #0

## 2021-09-11 MED ORDER — COVID-19 AT HOME ANTIGEN TEST VI KIT
PACK | 0 refills | Status: DC
  Filled 2021-09-11: qty 2, 4d supply, fill #0

## 2021-09-11 NOTE — Patient Instructions (Addendum)
Looks good Ok to run but start with one min walk one min jog Careful downhill Continue exercises Ice as cool down Celebrex continue See me in 2 months

## 2021-09-11 NOTE — Assessment & Plan Note (Signed)
Patient has had no reaccumulation of any of the inflammation at the moment.  Patient still has some mild radial tear that appears on ultrasound but no significant displacement of the meniscus.  Patient does have the Celebrex that has been working and giving him this.  Removed the meloxicam from his list given him a return to running progression and patient will follow up with me again in 2 to 3 months.

## 2021-09-28 ENCOUNTER — Telehealth: Payer: 59 | Admitting: Nurse Practitioner

## 2021-09-28 DIAGNOSIS — J208 Acute bronchitis due to other specified organisms: Secondary | ICD-10-CM | POA: Diagnosis not present

## 2021-09-28 MED ORDER — PSEUDOEPH-BROMPHEN-DM 30-2-10 MG/5ML PO SYRP
5.0000 mL | ORAL_SOLUTION | Freq: Four times a day (QID) | ORAL | 0 refills | Status: DC | PRN
  Filled 2021-09-28: qty 200, 10d supply, fill #0

## 2021-09-28 MED ORDER — PREDNISONE 20 MG PO TABS
20.0000 mg | ORAL_TABLET | Freq: Every day | ORAL | 0 refills | Status: DC
  Filled 2021-09-28: qty 6, 6d supply, fill #0

## 2021-09-28 MED ORDER — FLUTICASONE PROPIONATE 50 MCG/ACT NA SUSP
1.0000 | Freq: Every day | NASAL | 0 refills | Status: AC
  Filled 2021-09-28: qty 16, 60d supply, fill #0

## 2021-09-28 MED ORDER — PREDNISONE 20 MG PO TABS: 20.0000 mg | ORAL_TABLET | Freq: Every day | ORAL | 0 refills | Status: DC

## 2021-09-28 MED ORDER — PSEUDOEPH-BROMPHEN-DM 30-2-10 MG/5ML PO SYRP: 5.0000 mL | ORAL_SOLUTION | Freq: Four times a day (QID) | ORAL | 0 refills | Status: AC | PRN

## 2021-09-28 NOTE — Addendum Note (Signed)
Addended by: Geryl Rankins on: 09/28/2021 09:39 AM   Modules accepted: Orders

## 2021-09-28 NOTE — Progress Notes (Signed)
We are sorry that you are not feeling well.  Here is how we plan to help!  Based on your presentation I believe you most likely have A cough due to a virus.  This is called viral bronchitis and is best treated by rest, plenty of fluids and control of the cough.  You may use Ibuprofen or Tylenol as directed to help your symptoms.     In addition you may use a prescription bromfed cough syrup and fluticasone nose spray for your cough and nasal congestion. These have been sent to the pharmacy.   Unfortunately I am unable to provide prednisone for your cough as you are currently taking celebrex with would be contraindicated with prednisone.   From your responses in the eVisit questionnaire you describe inflammation in the upper respiratory tract which is causing a significant cough.  This is commonly called Bronchitis and has four common causes:   Allergies Viral Infections Acid Reflux Bacterial Infection Allergies, viruses and acid reflux are treated by controlling symptoms or eliminating the cause. An example might be a cough caused by taking certain blood pressure medications. You stop the cough by changing the medication. Another example might be a cough caused by acid reflux. Controlling the reflux helps control the cough.  USE OF BRONCHODILATOR ("RESCUE") INHALERS: There is a risk from using your bronchodilator too frequently.  The risk is that over-reliance on a medication which only relaxes the muscles surrounding the breathing tubes can reduce the effectiveness of medications prescribed to reduce swelling and congestion of the tubes themselves.  Although you feel brief relief from the bronchodilator inhaler, your asthma may actually be worsening with the tubes becoming more swollen and filled with mucus.  This can delay other crucial treatments, such as oral steroid medications. If you need to use a bronchodilator inhaler daily, several times per day, you should discuss this with your provider.   There are probably better treatments that could be used to keep your asthma under control.     HOME CARE Only take medications as instructed by your medical team. Complete the entire course of an antibiotic. Drink plenty of fluids and get plenty of rest. Avoid close contacts especially the very young and the elderly Cover your mouth if you cough or cough into your sleeve. Always remember to wash your hands A steam or ultrasonic humidifier can help congestion.   GET HELP RIGHT AWAY IF: You develop worsening fever. You become short of breath You cough up blood. Your symptoms persist after you have completed your treatment plan MAKE SURE YOU  Understand these instructions. Will watch your condition. Will get help right away if you are not doing well or get worse.    Thank you for choosing an e-visit.  Your e-visit answers were reviewed by a board certified advanced clinical practitioner to complete your personal care plan. Depending upon the condition, your plan could have included both over the counter or prescription medications.  Please review your pharmacy choice. Make sure the pharmacy is open so you can pick up prescription now. If there is a problem, you may contact your provider through [employer] and have the prescription routed to another pharmacy.  Your safety is important to [place]. If you have drug allergies check your prescription carefully.   For the next 24 hours you can use MyChart to ask questions about today's visit, request a non-urgent call back, or ask for a work or school excuse. You will get an email in the  next two days asking about your experience. I hope that your e-visit has been valuable and will speed your recovery.  

## 2021-09-28 NOTE — Addendum Note (Signed)
Addended by: Geryl Rankins on: 09/28/2021 10:03 AM   Modules accepted: Orders

## 2021-09-28 NOTE — Progress Notes (Signed)
I have spent 5 minutes in review of e-visit questionnaire, review and updating patient chart, medical decision making and response to patient.  ° °Derrick Orris W Eloina Ergle, NP ° °  °

## 2021-09-29 ENCOUNTER — Other Ambulatory Visit (HOSPITAL_BASED_OUTPATIENT_CLINIC_OR_DEPARTMENT_OTHER): Payer: Self-pay

## 2021-09-29 ENCOUNTER — Encounter: Payer: Self-pay | Admitting: Family Medicine

## 2021-10-01 ENCOUNTER — Other Ambulatory Visit (HOSPITAL_BASED_OUTPATIENT_CLINIC_OR_DEPARTMENT_OTHER): Payer: Self-pay

## 2021-10-10 ENCOUNTER — Other Ambulatory Visit: Payer: Self-pay

## 2021-10-10 ENCOUNTER — Other Ambulatory Visit (HOSPITAL_BASED_OUTPATIENT_CLINIC_OR_DEPARTMENT_OTHER): Payer: Self-pay

## 2021-10-10 ENCOUNTER — Ambulatory Visit: Payer: 59 | Attending: Internal Medicine

## 2021-10-10 ENCOUNTER — Ambulatory Visit: Payer: 59

## 2021-10-10 DIAGNOSIS — Z23 Encounter for immunization: Secondary | ICD-10-CM

## 2021-10-10 MED ORDER — PFIZER COVID-19 VAC BIVALENT 30 MCG/0.3ML IM SUSP
INTRAMUSCULAR | 0 refills | Status: DC
  Filled 2021-10-10: qty 0.3, 1d supply, fill #0

## 2021-10-10 NOTE — Progress Notes (Signed)
   Covid-19 Vaccination Clinic  Name:  Richard [name]    MRN: [id number] DOB: [birth year]  10/10/2021  [name] was observed post Covid-19 immunization for 15 minutes without incident. He was provided with Vaccine Information Sheet and instruction to access the V-Safe system.   [name] was instructed to call 911 with any severe reactions post vaccine: Difficulty breathing  Swelling of face and throat  A fast heartbeat  A bad rash all over body  Dizziness and weakness   Immunizations Administered     Name Date Dose VIS Date Route   Pfizer Covid-19 Vaccine Bivalent Booster 10/10/2021  2:33 PM 0.3 mL 08/06/2021 Intramuscular   Manufacturer: [place]   Lot: [id number]   [place]: [phone]

## 2021-10-15 DIAGNOSIS — H52223 Regular astigmatism, bilateral: Secondary | ICD-10-CM | POA: Diagnosis not present

## 2021-10-15 DIAGNOSIS — H5203 Hypermetropia, bilateral: Secondary | ICD-10-CM | POA: Diagnosis not present

## 2021-10-15 DIAGNOSIS — Z83511 Family history of glaucoma: Secondary | ICD-10-CM | POA: Diagnosis not present

## 2021-10-15 DIAGNOSIS — H40013 Open angle with borderline findings, low risk, bilateral: Secondary | ICD-10-CM | POA: Diagnosis not present

## 2021-11-05 ENCOUNTER — Encounter: Payer: Self-pay | Admitting: Family Medicine

## 2021-11-10 NOTE — Progress Notes (Signed)
Richard [name] [address] [place] Phone: [phone] Subjective:   IVilma Meckel, am serving as a scribe for Dr. Hulan Saas. This visit occurred during the SARS-CoV-2 public health emergency.  Safety protocols were in place, including screening questions prior to the visit, additional usage of staff PPE, and extensive cleaning of exam room while observing appropriate contact time as indicated for disinfecting solutions.   I'm seeing this patient by the request  of:  Caren Macadam, MD  CC: Knee pain follow-up  [id number]  09/11/2021 Patient has had no reaccumulation of any of the inflammation at the moment.  Patient still has some mild radial tear that appears on ultrasound but no significant displacement of the meniscus.  Patient does have the Celebrex that has been working and giving him this.  Removed the meloxicam from his list given him a return to running progression and patient will follow up with me again in 2 to 3 months.  Update 11/11/2021 Richard [name] is a 35 y.o. male coming in with complaint of L knee pain. Patient states has been tight the last couple of days. Only uses pennsaid when really irritating. Pain doesn't really grind as much, hasn't woken him up at night. No other complaints.     Past Medical History:  Diagnosis Date   Stress fracture, right ankle, sequela    Subluxation of extensor carpi ulnaris tendon, right, sequela    Past Surgical History:  Procedure Laterality Date   TONSILLECTOMY AND ADENOIDECTOMY  1996   WISDOM TOOTH EXTRACTION     Social History   Socioeconomic History   Marital status: Married    Spouse name: Not on file   Number of children: Not on file   Years of education: Not on file   Highest education level: Not on file  Occupational History   Not on file  Tobacco Use   Smoking status: Never   Smokeless tobacco: Never  Substance and Sexual Activity   Alcohol use: Yes     Comment: social on weekend   Drug use: Never   Sexual activity: Yes    Partners: Female  Other Topics Concern   Not on file  Social History Narrative   Not on file   Social Determinants of Health   Financial Resource Strain: Not on file  Food Insecurity: Not on file  Transportation Needs: Not on file  Physical Activity: Not on file  Stress: Not on file  Social Connections: Not on file   Allergies  Allergen Reactions   Prednisone     Racing heart   Family History  Problem Relation Age of Onset   High blood pressure Mother    High Cholesterol Mother    Arthritis-Osteo Father    Hearing loss Father    High blood pressure Father    High Cholesterol Father    Asthma Sister    Hearing loss Maternal Grandmother    Heart disease Maternal Grandmother    High blood pressure Maternal Grandmother    High Cholesterol Maternal Grandmother    Stroke Maternal Grandmother 80       TIAs   Heart attack Maternal Grandfather 70   Cancer Paternal Grandmother        ?lung; smoker   Hearing loss Paternal Grandfather    Bladder Cancer Paternal Uncle    Bladder Cancer Paternal Uncle    Heart Problems Maternal Uncle        ?left ventricular issues  Other Maternal Uncle        cardiac disease      Current Outpatient Medications (Respiratory):    fluticasone (FLONASE) 50 MCG/ACT nasal spray, Place 1 spray into both nostrils daily.  Current Outpatient Medications (Analgesics):    celecoxib (CELEBREX) 200 MG capsule, Take 1 capsule (200 mg total) by mouth 2 (two) times daily.   ibuprofen (ADVIL,MOTRIN) 600 MG tablet, ibuprofen 600 mg tablet   Current Outpatient Medications (Other):    COVID-19 At Home Antigen Test KIT, Use as directed.   COVID-19 mRNA bivalent vaccine, Pfizer, (PFIZER COVID-19 VAC BIVALENT) injection, Inject into the muscle.   Diclofenac Sodium (PENNSAID) 2 % SOLN, Apply 2 g topically 2 (two) times daily as needed (to affected area).   influenza vac split  quadrivalent PF (FLUARIX) 0.5 ML injection, Inject into the muscle.   Multiple Vitamin (MULTIVITAMIN ADULT PO), Take by mouth daily.   Reviewed prior external information including notes and imaging from  primary care provider As well as notes that were available from care everywhere and other healthcare systems.  Past medical history, social, surgical and family history all reviewed in electronic medical record.  No pertanent information unless stated regarding to the chief complaint.   Review of Systems:  No headache, visual changes, nausea, vomiting, diarrhea, constipation, dizziness, abdominal pain, skin rash, fevers, chills, night sweats, weight loss, swollen lymph nodes, body aches, joint swelling, chest pain, shortness of breath, mood changes. POSITIVE muscle aches  Objective  Blood pressure 138/88, pulse 83, height 5' 11"  (1.803 m), weight 223 lb (101.2 kg), SpO2 99 %.   General: No apparent distress alert and oriented x3 mood and affect normal, dressed appropriately.  HEENT: Pupils equal, extraocular movements intact  Respiratory: Patient's speak in full sentences and does not appear short of breath  Cardiovascular: No lower extremity edema, non tender, no erythema  Gait normal with good balance and coordination.  MSK: Left knee on exam has good range of motion.  Negative McMurray's.  Does have some mild audible clicking noted noted with the test but no pain.  Patient does have no significant swelling of the popliteal area.  Limited muscular skeletal ultrasound was performed and interpreted by Hulan Saas, M  Limited ultrasound shows the patient does have no significant hypoechoic changes.  He still has some mild tearing noted of the medial meniscus but no significant displacement.  No significant reaccumulation of the Baker's cyst noted. Impression: Interval improvement   Impression and Recommendations:     The above documentation has been reviewed and is accurate and  complete Lyndal Pulley, DO

## 2021-11-11 ENCOUNTER — Encounter: Payer: Self-pay | Admitting: Family Medicine

## 2021-11-11 ENCOUNTER — Ambulatory Visit: Payer: Self-pay

## 2021-11-11 ENCOUNTER — Ambulatory Visit: Payer: 59 | Admitting: Family Medicine

## 2021-11-11 ENCOUNTER — Other Ambulatory Visit: Payer: Self-pay

## 2021-11-11 VITALS — BP 138/88 | HR 83 | Ht 71.0 in | Wt 223.0 lb

## 2021-11-11 DIAGNOSIS — S83242A Other tear of medial meniscus, current injury, left knee, initial encounter: Secondary | ICD-10-CM | POA: Diagnosis not present

## 2021-11-11 NOTE — Patient Instructions (Signed)
See me when you need me

## 2021-11-11 NOTE — Assessment & Plan Note (Signed)
Patient has made significant improvement at this time.  No significant need to make any other significant changes and patient can increase activity as tolerated.  Patient to follow-up with me as needed

## 2021-11-12 ENCOUNTER — Ambulatory Visit: Payer: 59 | Admitting: Family Medicine

## 2021-11-12 VITALS — BP 138/90 | HR 73 | Temp 98.7°F | Ht 71.0 in | Wt 226.2 lb

## 2021-11-12 DIAGNOSIS — R82998 Other abnormal findings in urine: Secondary | ICD-10-CM

## 2021-11-12 DIAGNOSIS — R030 Elevated blood-pressure reading, without diagnosis of hypertension: Secondary | ICD-10-CM

## 2021-11-12 DIAGNOSIS — N529 Male erectile dysfunction, unspecified: Secondary | ICD-10-CM | POA: Diagnosis not present

## 2021-11-12 LAB — PSA: PSA: 0.49 ng/mL (ref 0.10–4.00)

## 2021-11-12 LAB — POC URINALSYSI DIPSTICK (AUTOMATED)
Bilirubin, UA: NEGATIVE
Blood, UA: NEGATIVE
Glucose, UA: NEGATIVE
Ketones, UA: NEGATIVE
Leukocytes, UA: NEGATIVE
Nitrite, UA: NEGATIVE
Protein, UA: NEGATIVE
Spec Grav, UA: 1.015 (ref 1.010–1.025)
Urobilinogen, UA: 0.2 E.U./dL
pH, UA: 6 (ref 5.0–8.0)

## 2021-11-12 LAB — COMPREHENSIVE METABOLIC PANEL
ALT: 20 U/L (ref 0–53)
AST: 16 U/L (ref 0–37)
Albumin: 4.6 g/dL (ref 3.5–5.2)
Alkaline Phosphatase: 61 U/L (ref 39–117)
BUN: 13 mg/dL (ref 6–23)
CO2: 32 mEq/L (ref 19–32)
Calcium: 9.4 mg/dL (ref 8.4–10.5)
Chloride: 101 mEq/L (ref 96–112)
Creatinine, Ser: 1.14 mg/dL (ref 0.40–1.50)
GFR: 82.6 mL/min (ref >60.00)
Glucose, Bld: 107 mg/dL — ABNORMAL HIGH (ref 70–99)
Potassium: 4.2 mEq/L (ref 3.5–5.1)
Sodium: 139 mEq/L (ref 135–145)
Total Bilirubin: 0.6 mg/dL (ref 0.2–1.2)
Total Protein: 6.8 g/dL (ref 6.0–8.3)

## 2021-11-12 LAB — CK: Total CK: 71 U/L (ref 7–232)

## 2021-11-12 LAB — TESTOSTERONE: Testosterone: 235.42 ng/dL — ABNORMAL LOW (ref 300.00–890.00)

## 2021-11-12 NOTE — Patient Instructions (Addendum)
Track home blood pressures and report back in a couple of weeks. If you are regularly over 130/85 then I would consider adding medication.   I will be in touch once I get back your lab results.

## 2021-11-12 NOTE — Progress Notes (Signed)
Gilford Rile Bianca DOB: 10/13/1986 Encounter date: 11/12/2021  This is a 35 y.o. male who presents with Chief Complaint  Patient presents with   Hematuria    Patient states he noticed "red" color in urine after running [place] trot race 2 weeks ago, denies dysuria   Erectile Dysfunction    X3 months    History of present illness: Mychart message sent 11/30:  On Thanksgiving morning, Estill Bamberg and I did a 3-mile run (I haven't run in about 6-7 months). I felt alright overall during the run but experienced calf cramping and fatigue. When we got back from the run I was hydrating frequently. About 3 or 4 hours after the run, my urine was a very dark red color but there was no pain during urination. I continued to hydrate and the color faded a bit in my urine then started to build back up again over the course of the following several hours. I did a virtual visit that evening and attached the notes from that call to this message.    The doctor I spoke with said he didn't think it was blood based on what I shared above and the images I showed him from my phone. He said it was likely acute rhabdomyolysis and suggested that if it didn't clear up in a day or two I should go to urgent care for testing. One other thing he mentioned was that he didn't believe it was related to bladder or prostate cancer given my age.   It took a couple of days but my urine did return to a normal color. I've been continuing to make sure I hydrate well and did a hard cycling workout last night without any issues.   3-4 hours after running was when he went to bathroom and there was no pain, but urine was dark red - like eating a lot of beets. 1.5 hours later lighter, but then darker again a couple of hours later. Hasn't happened since then. Hasn't run but has done some harder cycling workouts. Initial urine dark dard red, second more yellow, third was a little brownish.   Calf cramping was more significant than he usually would  get running. In past when not eating/drinking as well would get charlie horses. Usually would be able to run out cramps, but this time they stayed around. No cramps since then. Doesn't feel like he was dehydrated on that day that he ran. Hasn't taken celebrex for a couple of months. Hasn't taken ibuprofen recently. Not using pennsaid regularly unless needed (last use was 2 days ago but hadn't used in a couple of months prior to running incident).   No other supplements or protein powders.    Allergies  Allergen Reactions   Prednisone     Racing heart   Current Meds  Medication Sig   celecoxib (CELEBREX) 200 MG capsule Take 1 capsule (200 mg total) by mouth 2 (two) times daily. (Patient taking differently: Take 200 mg by mouth as needed.)   COVID-19 At Home Antigen Test KIT Use as directed.   COVID-19 mRNA bivalent vaccine, Pfizer, (PFIZER COVID-19 VAC BIVALENT) injection Inject into the muscle.   Diclofenac Sodium (PENNSAID) 2 % SOLN Apply 2 g topically 2 (two) times daily as needed (to affected area).   fluticasone (FLONASE) 50 MCG/ACT nasal spray Place 1 spray into both nostrils daily.   ibuprofen (ADVIL,MOTRIN) 600 MG tablet as needed.   influenza vac split quadrivalent PF (FLUARIX) 0.5 ML injection Inject into the muscle.  Multiple Vitamin (MULTIVITAMIN ADULT PO) Take by mouth daily.    Review of Systems  Constitutional:  Negative for chills, fatigue and fever.  Respiratory:  Negative for cough, chest tightness, shortness of breath and wheezing.   Cardiovascular:  Negative for chest pain, palpitations and leg swelling.   Objective:  BP 138/90 (BP Location: Left Arm, Patient Position: Sitting, Cuff Size: Large)   Pulse 73   Temp 98.7 F (37.1 C) (Oral)   Ht _0  (1.803 m)   Wt 226 lb 3.2 oz (102.6 kg)   SpO2 99%   BMI 31.55 kg/m   Weight: 226 lb 3.2 oz (102.6 kg)   BP Readings from Last 3 Encounters:  11/12/21 138/90  11/11/21 138/88  09/11/21 118/78   Wt Readings  from Last 3 Encounters:  11/12/21 226 lb 3.2 oz (102.6 kg)  11/11/21 223 lb (101.2 kg)  09/11/21 223 lb (101.2 kg)    Physical Exam Constitutional:      General: He is not in acute distress.    Appearance: He is well-developed.  Cardiovascular:     Rate and Rhythm: Normal rate and regular rhythm.     Heart sounds: Normal heart sounds. No murmur heard.   No friction rub.  Pulmonary:     Effort: Pulmonary effort is normal. No respiratory distress.     Breath sounds: Normal breath sounds. No wheezing or rales.  Abdominal:     General: Abdomen is flat. Bowel sounds are normal.     Palpations: Abdomen is soft.     Tenderness: There is no abdominal tenderness. There is no right CVA tenderness or left CVA tenderness.     Hernia: There is no hernia in the left inguinal area or right inguinal area.  Genitourinary:    Penis: Normal and circumcised.      Testes: Normal.  Musculoskeletal:     Right lower leg: No edema.     Left lower leg: No edema.  Lymphadenopathy:     Lower Body: No right inguinal adenopathy. No left inguinal adenopathy.  Neurological:     Mental Status: He is alert and oriented to person, place, and time.  Psychiatric:        Behavior: Behavior normal.    Assessment/Plan  1. Dark urine Suspect related to acute rhabo which has resolved.  - POCT Urinalysis Dipstick (Automated) - Urinalysis with Reflex Microscopic - CK - CMP  2. Erectile dysfunction, unspecified erectile dysfunction type Start with testosterone; further eval pending. - Testosterone; Future - PSA; Future  3. Elevated bp: Check at home; report back in 2 weeks. Last time he did this home reads were good.   Return for pending labs and bp report.      Micheline Rough, MD

## 2021-11-14 NOTE — Addendum Note (Signed)
Addended by: Nilda Riggs on: 11/14/2021 09:45 AM   Modules accepted: Orders

## 2021-11-18 ENCOUNTER — Other Ambulatory Visit: Payer: 59

## 2021-11-18 DIAGNOSIS — N529 Male erectile dysfunction, unspecified: Secondary | ICD-10-CM

## 2021-11-18 LAB — TESTOSTERONE: Testosterone: 378.5 ng/dL (ref 300.00–890.00)

## 2021-11-19 ENCOUNTER — Encounter: Payer: Self-pay | Admitting: Family Medicine

## 2021-11-19 DIAGNOSIS — R7989 Other specified abnormal findings of blood chemistry: Secondary | ICD-10-CM

## 2021-11-19 DIAGNOSIS — R310 Gross hematuria: Secondary | ICD-10-CM

## 2021-11-21 ENCOUNTER — Ambulatory Visit: Payer: 59 | Admitting: Family Medicine

## 2021-11-21 DIAGNOSIS — H40013 Open angle with borderline findings, low risk, bilateral: Secondary | ICD-10-CM | POA: Diagnosis not present

## 2021-11-21 DIAGNOSIS — Z83511 Family history of glaucoma: Secondary | ICD-10-CM | POA: Diagnosis not present

## 2021-12-12 ENCOUNTER — Other Ambulatory Visit (HOSPITAL_COMMUNITY): Payer: Self-pay

## 2021-12-12 MED ORDER — TADALAFIL 20 MG PO TABS
ORAL_TABLET | ORAL | 5 refills | Status: DC
  Filled 2021-12-12: qty 30, 30d supply, fill #0

## 2021-12-16 ENCOUNTER — Encounter: Payer: Self-pay | Admitting: Family Medicine

## 2021-12-22 ENCOUNTER — Encounter: Payer: Self-pay | Admitting: Family Medicine

## 2021-12-29 ENCOUNTER — Other Ambulatory Visit: Payer: Self-pay | Admitting: Urology

## 2021-12-31 ENCOUNTER — Other Ambulatory Visit: Payer: Self-pay | Admitting: Urology

## 2021-12-31 DIAGNOSIS — C641 Malignant neoplasm of right kidney, except renal pelvis: Secondary | ICD-10-CM

## 2022-01-06 ENCOUNTER — Other Ambulatory Visit (HOSPITAL_BASED_OUTPATIENT_CLINIC_OR_DEPARTMENT_OTHER): Payer: Self-pay

## 2022-01-06 MED ORDER — DIAZEPAM 10 MG PO TABS
ORAL_TABLET | ORAL | 0 refills | Status: DC
  Filled 2022-01-06: qty 1, 1d supply, fill #0

## 2022-01-07 ENCOUNTER — Other Ambulatory Visit (HOSPITAL_COMMUNITY): Payer: Self-pay

## 2022-01-08 ENCOUNTER — Other Ambulatory Visit (HOSPITAL_BASED_OUTPATIENT_CLINIC_OR_DEPARTMENT_OTHER): Payer: Self-pay

## 2022-01-08 MED ORDER — COVID-19 AT HOME ANTIGEN TEST VI KIT
PACK | 0 refills | Status: DC
  Filled 2022-01-08: qty 2, 4d supply, fill #0

## 2022-01-08 NOTE — Patient Instructions (Signed)
DUE TO COVID-19 ONLY ONE VISITOR IS ALLOWED TO COME WITH YOU AND STAY IN THE WAITING ROOM ONLY DURING PRE OP AND PROCEDURE.   **NO VISITORS ARE ALLOWED IN THE SHORT STAY AREA OR RECOVERY ROOM!!**  IF YOU WILL BE ADMITTED INTO THE HOSPITAL YOU ARE ALLOWED ONLY TWO SUPPORT PEOPLE DURING VISITATION HOURS ONLY (7 AM -8PM)   The support person(s) must pass our screening, gel in and out, and wear a mask at all times, including in the patients room. Patients must also wear a mask when staff or their support person are in the room. Visitors GUEST BADGE MUST BE WORN VISIBLY  One adult visitor may remain with you overnight and MUST be in the room by 8 P.M.  No visitors under the age of 42. Any visitor under the age of 67 must be accompanied by an adult.    COVID SWAB TESTING MUST BE COMPLETED ON: 01/28/22    Site: [facility] [place] [place] [place] [place] Enter: Main Entrance have a seat in the waiting area to the right of main entrance (DO NOT [place]!!!!!) Dial: [phone] to alert staff you have arrived  You are not required to quarantine, however you are required to wear a well-fitted mask when you are out and around people not in your household.  Hand Hygiene often Do NOT share personal items Notify your provider if you are in close contact with someone who has COVID or you develop fever 100.4 or greater, new onset of sneezing, cough, sore throat, shortness of breath or body aches.  [address], [address], must go inside of the hospital, NOT A DRIVE THRU!  (Must self quarantine after testing. Follow instructions on handout.)       Your procedure is scheduled on: 01/30/22   Report to [facility] Main Entrance    Report to Short Stay at : 5:15 AM   Call this number if you have problems the morning of surgery [phone]   CLEAR LIQUID DIET: [place]: 4:30  AM  Foods Allowed                                                                     Foods Excluded  Water, Black Coffee and tea, regular and decaf                             liquids that you cannot  Plain Jell-O in any flavor  (No red)                                           see through such as: Fruit ices (not with fruit pulp)                                     milk, soups, orange juice              Iced Popsicles (No red)  All solid food                                   Apple juices Sports drinks like Gatorade (No red) Lightly seasoned clear broth or consume(fat free) Sugar Sample Menu Breakfast                                Lunch                                     Supper Cranberry juice                    Beef broth                            Chicken broth Jell-O                                     Grape juice                           Apple juice Coffee or tea                        Jell-O                                      Popsicle                                                Coffee or tea                        Coffee or tea    FOLLOW BOWEL PREP AND ANY ADDITIONAL PRE OP INSTRUCTIONS YOU RECEIVED FROM YOUR SURGEON'S OFFICE!!!     Oral Hygiene is also important to reduce your risk of infection.                                    Remember - BRUSH YOUR TEETH THE MORNING OF SURGERY WITH YOUR REGULAR TOOTHPASTE   Do NOT smoke after Midnight   Take these medicines the morning of surgery with A SIP OF WATER:   DO NOT TAKE ANY ORAL DIABETIC MEDICATIONS DAY OF YOUR SURGERY                              You may not have any metal on your body including hair pins, jewelry, and body piercing             Do not wear lotions, powders, perfumes/cologne, or deodorant              Men may shave face and neck.   Do not bring valuables to the hospital. [facility] IS NOT  RESPONSIBLE   FOR VALUABLES.   Contacts, dentures or  bridgework may not be worn into surgery.   Bring small overnight bag day of surgery.    Patients discharged on the day of surgery will not be allowed to drive home.  Someone needs to stay with you for the first 24 hours after anesthesia.   Special Instructions: Bring a copy of your healthcare power of attorney and living will documents         the day of surgery if you haven't scanned them before.              Please read over the following fact sheets you were given: IF YOU HAVE QUESTIONS ABOUT YOUR PRE-OP INSTRUCTIONS PLEASE CALL [phone]     [facility] - Preparing for Surgery Before surgery, you can play an important role.  Because skin is not sterile, your skin needs to be as free of germs as possible.  You can reduce the number of germs on your skin by washing with CHG (chlorahexidine gluconate) soap before surgery.  CHG is an antiseptic cleaner which kills germs and bonds with the skin to continue killing germs even after washing. Please DO NOT use if you have an allergy to CHG or antibacterial soaps.  If your skin becomes reddened/irritated stop using the CHG and inform your nurse when you arrive at Short Stay. Do not shave (including legs and underarms) for at least 48 hours prior to the first CHG shower.  You may shave your face/neck. Please follow these instructions carefully:  1.  Shower with CHG Soap the night before surgery and the  morning of Surgery.  2.  If you choose to wash your hair, wash your hair first as usual with your  normal  shampoo.  3.  After you shampoo, rinse your hair and body thoroughly to remove the  shampoo.                           4.  Use CHG as you would any other liquid soap.  You can apply chg directly  to the skin and wash                       Gently with a scrungie or clean washcloth.  5.  Apply the CHG Soap to your body ONLY FROM THE NECK DOWN.   Do not use on face/ open                           Wound or open sores. Avoid contact with eyes, ears  mouth and genitals (private parts).                       Wash face,  Genitals (private parts) with your normal soap.             6.  Wash thoroughly, paying special attention to the area where your surgery  will be performed.  7.  Thoroughly rinse your body with warm water from the neck down.  8.  DO NOT shower/wash with your normal soap after using and rinsing off  the CHG Soap.                9.  Pat yourself dry with a clean towel.            10.  Wear clean pajamas.  11.  Place clean sheets on your bed the night of your first shower and do not  sleep with pets. Day of Surgery : Do not apply any lotions/deodorants the morning of surgery.  Please wear clean clothes to the hospital/surgery center.  FAILURE TO FOLLOW THESE INSTRUCTIONS MAY RESULT IN THE CANCELLATION OF YOUR SURGERY PATIENT SIGNATURE_________________________________  NURSE SIGNATURE__________________________________  ________________________________________________________________________

## 2022-01-11 ENCOUNTER — Ambulatory Visit
Admission: RE | Admit: 2022-01-11 | Discharge: 2022-01-11 | Disposition: A | Discharge status: Home / Self Care | Payer: No Typology Code available for payment source | Source: Ambulatory Visit | Attending: Urology | Admitting: Urology

## 2022-01-11 ENCOUNTER — Other Ambulatory Visit: Payer: Self-pay

## 2022-01-11 DIAGNOSIS — C641 Malignant neoplasm of right kidney, except renal pelvis: Secondary | ICD-10-CM

## 2022-01-11 IMAGING — MR MR ABDOMEN WO/W CM
18 series · 48 of 48 positions shown · IV contrast (MULTIHANCE)
Comparison: CT [DATE].

CLINICAL DATA: Right renal cell carcinoma.

EXAM:
MRI ABDOMEN WITHOUT AND WITH CONTRAST
TECHNIQUE: Multiplanar multisequence MR imaging of the abdomen was performed
both before and after the administration of intravenous contrast.
CONTRAST:  20mL MULTIHANCE GADOBENATE DIMEGLUMINE 529 MG/ML IV SOLN

[Series 3: T2 · coronal · 5.0mm · 1.56mm/px · 2 of 37 slices shown (1 of 4)]
[im 1/37]
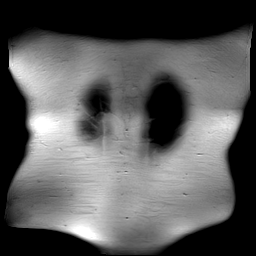
[im 37/37]
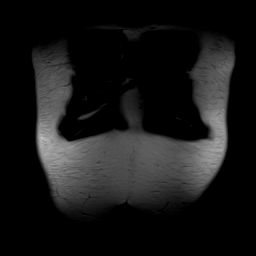

[Series 4: T1 · axial · 3.0mm · 1.19mm/px · z∈[-87,+126]mm · 6 of 144 slices shown]
[im 1/144]
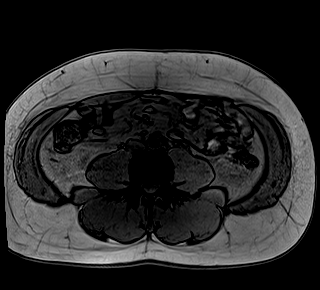
[im 29/144]
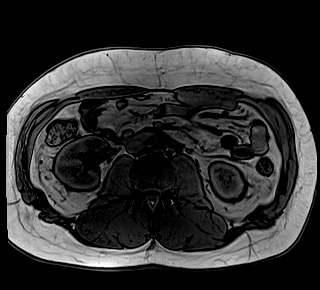
[im 58/144]
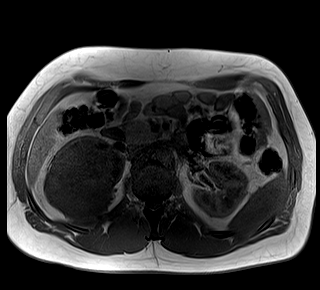
[im 86/144]
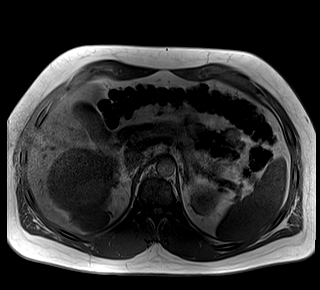
[im 115/144]
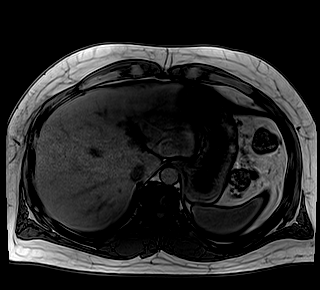
[im 144/144]
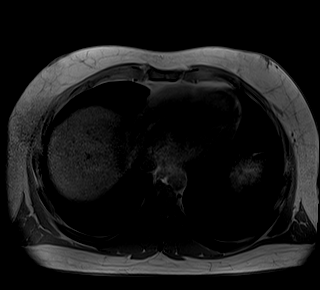

[Series 5: T2 · axial · 5.0mm · 1.48mm/px · 1 of 38 slices shown (2 of 4)]
[im 1/38]
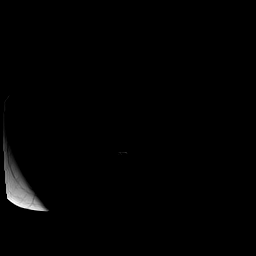

[Series 6: DWI · axial · 5.0mm · 1.42mm/px · z∈[-82,+140]mm · 4 of 114 slices shown (1 of 2)]
[im 1/114]
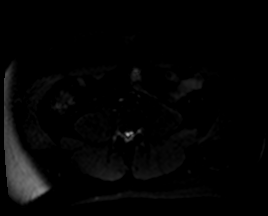
[im 38/114]
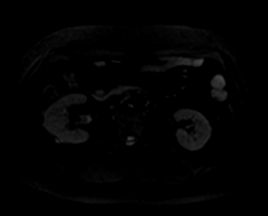
[im 76/114]
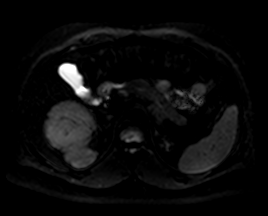
[im 114/114]
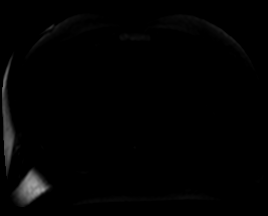

[Series 7: DWI · axial · 5.0mm · 1.42mm/px · 1 of 38 slices shown (2 of 2)]
[im 1/38]
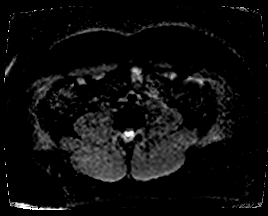

[Series 8: T2 · axial · 6.0mm · 1.22mm/px · 1 of 5 slices shown (3 of 4)]
[im 1/5]
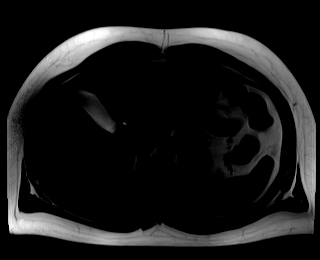

[Series 9: bSSFP · axial · 5.0mm · 1.25mm/px · z∈[-98,+136]mm · 2 of 40 slices shown]
[im 1/40]
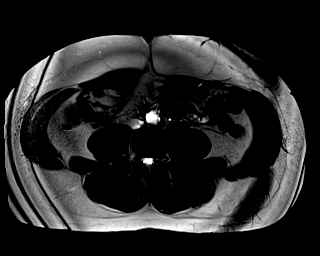
[im 40/40]
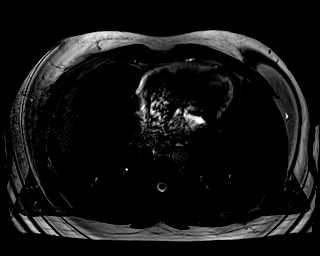

[Series 10: T2 · axial · 6.0mm · 1.22mm/px · 1 of 34 slices shown (4 of 4)]
[im 1/34]
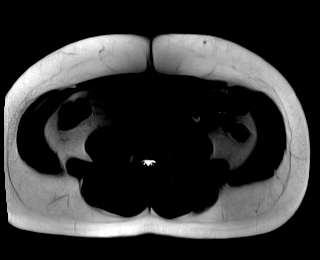

[Series 11: T1 dynamic · axial · non-contrast · 3.0mm · 1.25mm/px · z∈[-82,+154]mm · 3 of 80 slices shown]
[im 1/80]
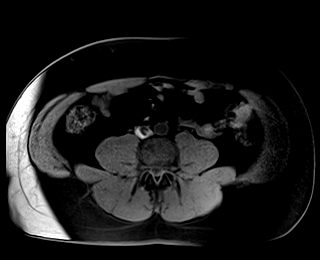
[im 40/80]
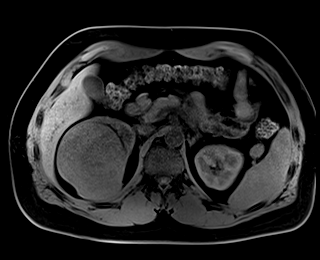
[im 80/80]
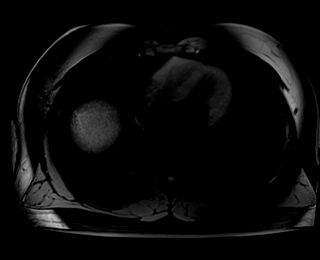

[Series 12: T1 dynamic post-contrast · axial · 3.0mm · 1.25mm/px · z∈[-82,+154]mm · 3 of 80 slices shown (1 of 9)]
[im 1/80]
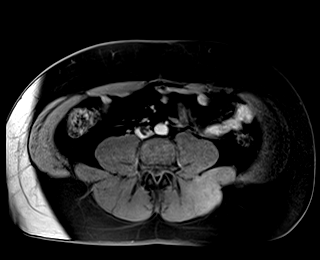
[im 40/80]
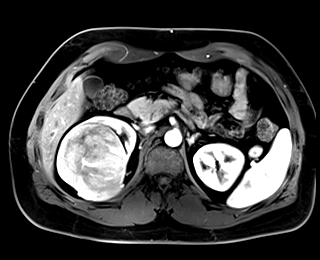
[im 80/80]
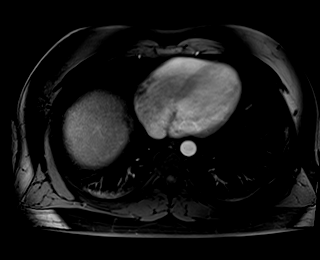

[Series 13: T1 dynamic post-contrast · axial · 3.0mm · 1.25mm/px · z∈[-82,+154]mm · 3 of 80 slices shown (2 of 9)]
[im 1/80]
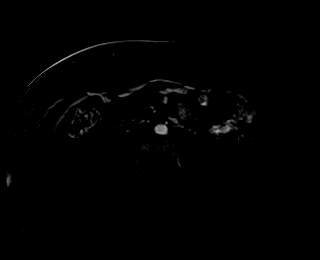
[im 40/80]
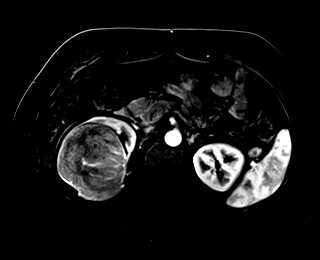
[im 80/80]
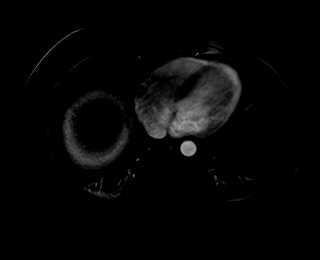

[Series 14: T1 dynamic post-contrast · axial · 3.0mm · 1.25mm/px · z∈[-82,+154]mm · 3 of 80 slices shown (3 of 9)]
[im 1/80]
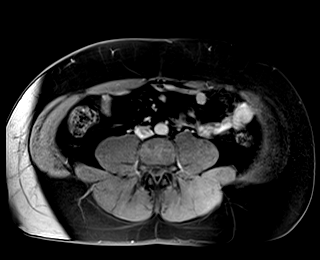
[im 40/80]
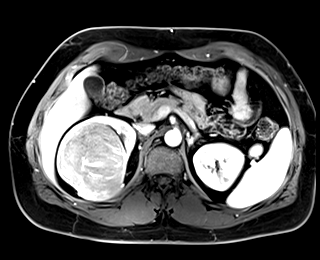
[im 80/80]
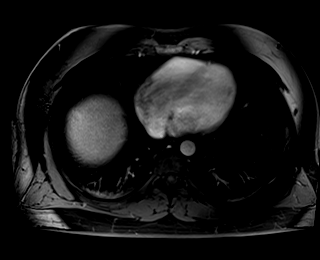

[Series 15: T1 dynamic post-contrast · axial · 3.0mm · 1.25mm/px · z∈[-82,+154]mm · 3 of 80 slices shown (4 of 9)]
[im 1/80]
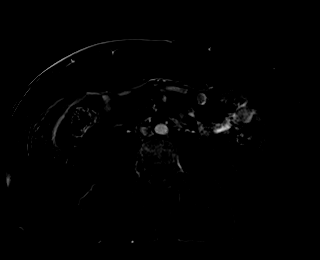
[im 40/80]
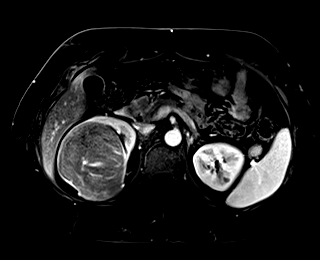
[im 80/80]
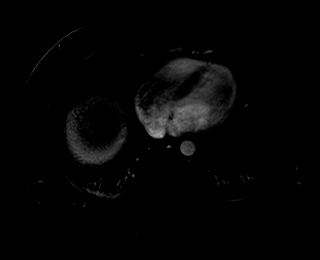

[Series 16: T1 dynamic post-contrast · axial · 3.0mm · 1.25mm/px · z∈[-82,+154]mm · 3 of 80 slices shown (5 of 9)]
[im 1/80]
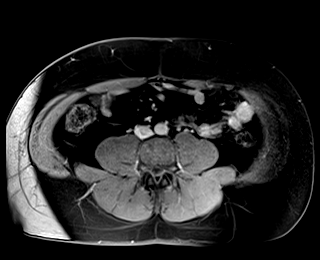
[im 40/80]
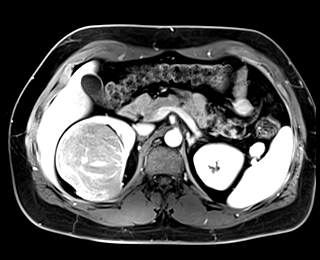
[im 80/80]
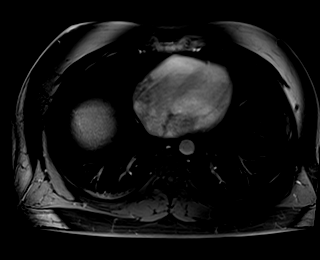

[Series 17: T1 dynamic post-contrast · axial · 3.0mm · 1.25mm/px · z∈[-82,+154]mm · 3 of 80 slices shown (6 of 9)]
[im 1/80]
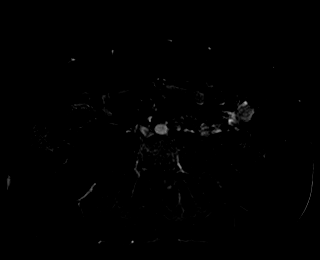
[im 40/80]
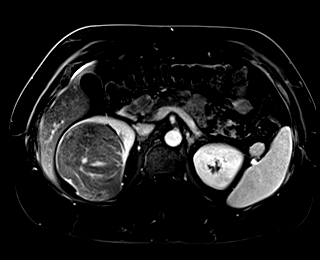
[im 80/80]
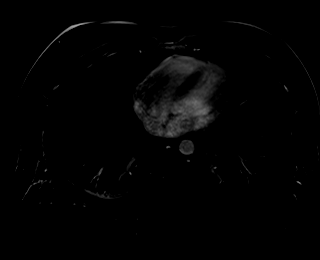

[Series 18: T1 dynamic post-contrast · coronal · 3.0mm · 1.25mm/px · 3 of 72 slices shown (7 of 9)]
[im 1/72]
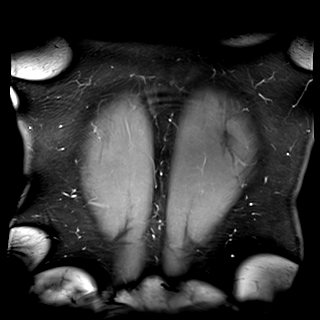
[im 36/72]
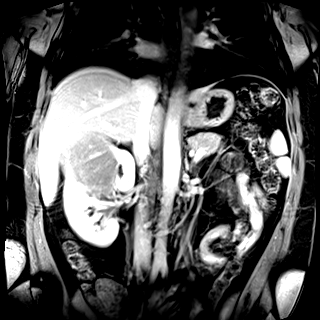
[im 72/72]
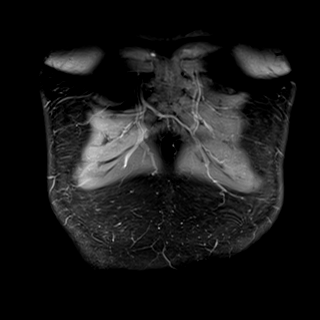

[Series 19: T1 dynamic post-contrast · axial · 3.0mm · 1.25mm/px · z∈[-82,+154]mm · 3 of 80 slices shown (8 of 9)]
[im 1/80]
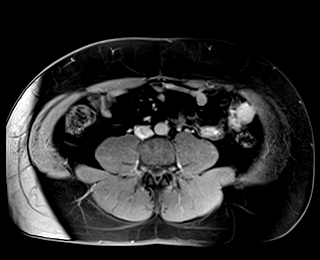
[im 40/80]
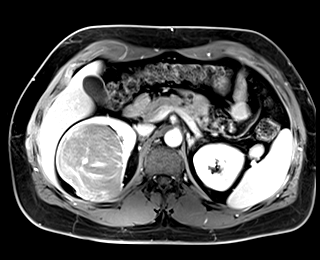
[im 80/80]
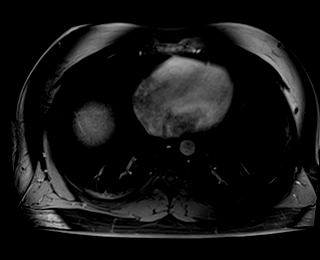

[Series 20: T1 dynamic post-contrast · axial · 3.0mm · 1.25mm/px · z∈[-82,+154]mm · 3 of 80 slices shown (9 of 9)]
[im 1/80]
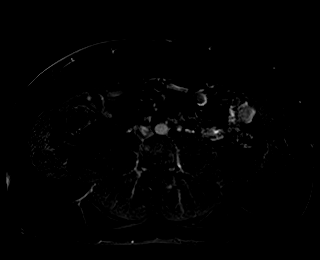
[im 40/80]
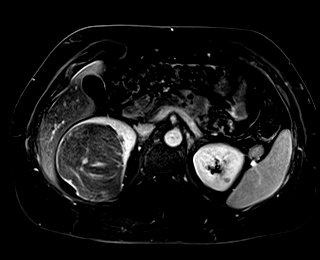
[im 80/80]
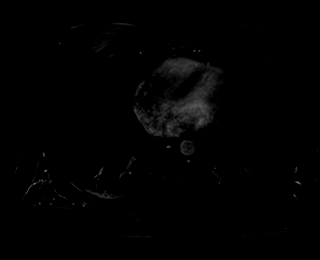

[48 of 48 positions shown; findings below may reference images not displayed]

FINDINGS: Lower chest: No acute findings.

Hepatobiliary: No hepatic steatosis. No suspicious hepatic lesion.
Gallbladder is unremarkable. No biliary ductal dilation.

Pancreas: Intrinsic T1 signal of the pancreatic parenchyma is within
normal limits. Homogeneous enhancement pancreatic parenchyma post
contrast enhancement. No pancreatic ductal dilation. No cystic or
solid hyperenhancing pancreatic lesion identified.

Spleen:  Within normal limits in size and appearance.

Adrenals/Urinary Tract:  Bilateral adrenal glands appear normal.

Enhancing heterogeneous partially exophytic right upper/interpolar
renal lesion measures 9.7 x 8.2 x 8.7 cm on images [DATE] and [DATE],
consistent with renal cell carcinoma.

Tiny T2 hyperintense left renal cysts.

Stomach/Bowel: Visualized portions within the abdomen are
unremarkable.

Vascular/Lymphatic: No evidence of renal tumor in vein. No
pathologically enlarged abdominal lymph nodes.

Other:  No abdominal free fluid.

Musculoskeletal: No suspicious bone lesions identified.
IMPRESSION: 1. Enhancing 9.7 cm right upper/interpolar renal mass, consistent
with renal cell carcinoma.
2. No evidence of renal tumor in vein. No evidence of metastatic
disease in the abdomen.

## 2022-01-11 MED ORDER — GADOBENATE DIMEGLUMINE 529 MG/ML IV SOLN
20.0000 mL | Freq: Once | INTRAVENOUS | Status: AC | PRN
  Administered 2022-01-11: 20 mL via INTRAVENOUS

## 2022-01-12 ENCOUNTER — Encounter (HOSPITAL_COMMUNITY)
Admission: RE | Admit: 2022-01-12 | Discharge: 2022-01-12 | Disposition: A | Discharge status: Home / Self Care | Payer: No Typology Code available for payment source | Source: Ambulatory Visit | Attending: Urology | Admitting: Urology

## 2022-01-12 ENCOUNTER — Other Ambulatory Visit: Payer: Self-pay

## 2022-01-12 ENCOUNTER — Encounter (HOSPITAL_COMMUNITY): Payer: Self-pay

## 2022-01-12 VITALS — BP 147/97 | HR 69 | Temp 98.1°F | Ht 71.0 in | Wt 222.0 lb

## 2022-01-12 DIAGNOSIS — Z01812 Encounter for preprocedural laboratory examination: Secondary | ICD-10-CM | POA: Diagnosis not present

## 2022-01-12 DIAGNOSIS — Z01818 Encounter for other preprocedural examination: Secondary | ICD-10-CM

## 2022-01-12 LAB — CBC
HCT: 43.7 % (ref 39.0–52.0)
Hemoglobin: 15.1 g/dL (ref 13.0–17.0)
MCH: 29.7 pg (ref 26.0–34.0)
MCHC: 34.6 g/dL (ref 30.0–36.0)
MCV: 86 fL (ref 80.0–100.0)
Platelets: 203 10*3/uL (ref 150–400)
RBC: 5.08 MIL/uL (ref 4.22–5.81)
RDW: 12.2 % (ref 11.5–15.5)
WBC: 5.3 10*3/uL (ref 4.0–10.5)
nRBC: 0 % (ref 0.0–0.2)

## 2022-01-12 NOTE — Progress Notes (Signed)
COVID Vaccine Completed:Yes Date COVID Vaccine completed: 10/10/21 COVID vaccine manufacturer: Jette  x 4 COVID Test: 01/28/22 Bowel prep reminder: Yes  PCP - Dr. Micheline Rough Cardiologist -   Chest x-ray -  EKG -  Stress Test -  ECHO -  Cardiac Cath -  Pacemaker/ICD device last checked:  Sleep Study -  CPAP -   Fasting Blood Sugar -  Checks Blood Sugar _____ times a day  Blood Thinner Instructions: Aspirin Instructions: Last Dose:  Anesthesia review:   Patient denies shortness of breath, fever, cough and chest pain at PAT appointment   Patient verbalized understanding of instructions that were given to them at the PAT appointment. Patient was also instructed that they will need to review over the PAT instructions again at home before surgery.

## 2022-01-28 ENCOUNTER — Other Ambulatory Visit: Payer: Self-pay

## 2022-01-28 ENCOUNTER — Encounter (HOSPITAL_COMMUNITY)
Admission: RE | Admit: 2022-01-28 | Discharge: 2022-01-28 | Disposition: A | Discharge status: Home / Self Care | Payer: No Typology Code available for payment source | Source: Ambulatory Visit | Attending: Urology | Admitting: Urology

## 2022-01-28 LAB — SARS CORONAVIRUS 2 (TAT 6-24 HRS): SARS Coronavirus 2: NEGATIVE

## 2022-01-29 ENCOUNTER — Encounter (HOSPITAL_COMMUNITY): Payer: Self-pay | Admitting: Urology

## 2022-01-30 ENCOUNTER — Inpatient Hospital Stay (HOSPITAL_COMMUNITY): Payer: No Typology Code available for payment source | Admitting: Anesthesiology

## 2022-01-30 ENCOUNTER — Other Ambulatory Visit (HOSPITAL_COMMUNITY): Payer: Self-pay

## 2022-01-30 ENCOUNTER — Inpatient Hospital Stay (HOSPITAL_COMMUNITY)
Admission: RE | Admit: 2022-01-30 | Discharge: 2022-02-02 | DRG: 658 | Disposition: A | Discharge status: Home / Self Care | Payer: No Typology Code available for payment source | Source: Ambulatory Visit | Attending: Urology | Admitting: Urology

## 2022-01-30 ENCOUNTER — Encounter (HOSPITAL_COMMUNITY): Payer: Self-pay | Admitting: Urology

## 2022-01-30 ENCOUNTER — Other Ambulatory Visit: Payer: Self-pay

## 2022-01-30 ENCOUNTER — Inpatient Hospital Stay (HOSPITAL_COMMUNITY): Payer: No Typology Code available for payment source | Admitting: Physician Assistant

## 2022-01-30 ENCOUNTER — Encounter (HOSPITAL_COMMUNITY)
Admission: RE | Disposition: A | Discharge status: Home / Self Care | Payer: Self-pay | Source: Ambulatory Visit | Attending: Urology

## 2022-01-30 DIAGNOSIS — Z20822 Contact with and (suspected) exposure to covid-19: Secondary | ICD-10-CM | POA: Diagnosis present

## 2022-01-30 DIAGNOSIS — N2889 Other specified disorders of kidney and ureter: Secondary | ICD-10-CM | POA: Diagnosis present

## 2022-01-30 DIAGNOSIS — Z79899 Other long term (current) drug therapy: Secondary | ICD-10-CM | POA: Diagnosis not present

## 2022-01-30 DIAGNOSIS — C641 Malignant neoplasm of right kidney, except renal pelvis: Principal | ICD-10-CM | POA: Diagnosis present

## 2022-01-30 DIAGNOSIS — Z01812 Encounter for preprocedural laboratory examination: Secondary | ICD-10-CM

## 2022-01-30 DIAGNOSIS — Z01818 Encounter for other preprocedural examination: Secondary | ICD-10-CM

## 2022-01-30 DIAGNOSIS — Z6830 Body mass index (BMI) 30.0-30.9, adult: Secondary | ICD-10-CM | POA: Diagnosis not present

## 2022-01-30 DIAGNOSIS — Z8052 Family history of malignant neoplasm of bladder: Secondary | ICD-10-CM

## 2022-01-30 DIAGNOSIS — Z888 Allergy status to other drugs, medicaments and biological substances status: Secondary | ICD-10-CM | POA: Diagnosis not present

## 2022-01-30 DIAGNOSIS — E669 Obesity, unspecified: Secondary | ICD-10-CM | POA: Diagnosis present

## 2022-01-30 HISTORY — PX: ROBOT ASSISTED LAPAROSCOPIC NEPHRECTOMY: SHX5140

## 2022-01-30 LAB — TYPE AND SCREEN
ABO/RH(D): A POS
Antibody Screen: NEGATIVE

## 2022-01-30 LAB — HEMOGLOBIN AND HEMATOCRIT, BLOOD
HCT: 40.6 % (ref 39.0–52.0)
Hemoglobin: 14 g/dL (ref 13.0–17.0)

## 2022-01-30 LAB — ABO/RH: ABO/RH(D): A POS

## 2022-01-30 SURGERY — NEPHRECTOMY, RADICAL, ROBOT-ASSISTED, LAPAROSCOPIC, ADULT
Anesthesia: General | Laterality: Right

## 2022-01-30 MED ORDER — OXYCODONE HCL 5 MG/5ML PO SOLN: 5.0000 mg | Freq: Once | ORAL | Status: AC | PRN

## 2022-01-30 MED ORDER — MIDAZOLAM HCL 2 MG/2ML IJ SOLN
INTRAMUSCULAR | Status: AC
  Filled 2022-01-30: qty 2

## 2022-01-30 MED ORDER — ONDANSETRON HCL 4 MG/2ML IJ SOLN
INTRAMUSCULAR | Status: DC | PRN
Start: 2022-01-30 — End: 2022-01-30
  Administered 2022-01-30: 4 mg via INTRAVENOUS

## 2022-01-30 MED ORDER — LACTATED RINGERS IV SOLN: INTRAVENOUS | Status: DC | PRN

## 2022-01-30 MED ORDER — DEXAMETHASONE SODIUM PHOSPHATE 10 MG/ML IJ SOLN
INTRAMUSCULAR | Status: DC | PRN
  Administered 2022-01-30: 5 mg via INTRAVENOUS

## 2022-01-30 MED ORDER — KETAMINE HCL-SODIUM CHLORIDE 100-0.9 MG/10ML-% IV SOSY
PREFILLED_SYRINGE | INTRAVENOUS | Status: DC | PRN
  Administered 2022-01-30: 30 mg via INTRAVENOUS
  Administered 2022-01-30 (×2): 10 mg via INTRAVENOUS

## 2022-01-30 MED ORDER — ONDANSETRON HCL 4 MG/2ML IJ SOLN: 4.0000 mg | Freq: Once | INTRAMUSCULAR | Status: DC | PRN

## 2022-01-30 MED ORDER — LACTATED RINGERS IR SOLN
Status: DC | PRN
  Administered 2022-01-30: 1

## 2022-01-30 MED ORDER — KETAMINE HCL 50 MG/5ML IJ SOSY
PREFILLED_SYRINGE | INTRAMUSCULAR | Status: AC
Start: 2022-01-30 — End: ?
  Filled 2022-01-30: qty 5

## 2022-01-30 MED ORDER — ACETAMINOPHEN 500 MG PO TABS
1000.0000 mg | ORAL_TABLET | Freq: Once | ORAL | Status: AC
  Administered 2022-01-30: 1000 mg via ORAL

## 2022-01-30 MED ORDER — AMISULPRIDE (ANTIEMETIC) 5 MG/2ML IV SOLN: 10.0000 mg | Freq: Once | INTRAVENOUS | Status: DC | PRN

## 2022-01-30 MED ORDER — FENTANYL CITRATE PF 50 MCG/ML IJ SOSY
PREFILLED_SYRINGE | INTRAMUSCULAR | Status: AC
  Filled 2022-01-30: qty 2

## 2022-01-30 MED ORDER — OXYCODONE HCL 5 MG PO TABS
ORAL_TABLET | ORAL | Status: AC
  Filled 2022-01-30: qty 1

## 2022-01-30 MED ORDER — ROCURONIUM BROMIDE 10 MG/ML (PF) SYRINGE
PREFILLED_SYRINGE | INTRAVENOUS | Status: AC
  Filled 2022-01-30: qty 20

## 2022-01-30 MED ORDER — PROPOFOL 10 MG/ML IV BOLUS
INTRAVENOUS | Status: AC
  Filled 2022-01-30: qty 20

## 2022-01-30 MED ORDER — DIPHENHYDRAMINE HCL 12.5 MG/5ML PO ELIX: 12.5000 mg | ORAL_SOLUTION | Freq: Four times a day (QID) | ORAL | Status: DC | PRN

## 2022-01-30 MED ORDER — OXYBUTYNIN CHLORIDE 5 MG PO TABS: 5.0000 mg | ORAL_TABLET | Freq: Three times a day (TID) | ORAL | Status: DC | PRN

## 2022-01-30 MED ORDER — CHLORHEXIDINE GLUCONATE 0.12 % MT SOLN
15.0000 mL | Freq: Once | OROMUCOSAL | Status: AC
  Administered 2022-01-30: 15 mL via OROMUCOSAL

## 2022-01-30 MED ORDER — FENTANYL CITRATE PF 50 MCG/ML IJ SOSY
PREFILLED_SYRINGE | INTRAMUSCULAR | Status: AC
  Filled 2022-01-30: qty 1

## 2022-01-30 MED ORDER — BUPIVACAINE LIPOSOME 1.3 % IJ SUSP
INTRAMUSCULAR | Status: AC
  Filled 2022-01-30: qty 20

## 2022-01-30 MED ORDER — PHENYLEPHRINE 40 MCG/ML (10ML) SYRINGE FOR IV PUSH (FOR BLOOD PRESSURE SUPPORT)
PREFILLED_SYRINGE | INTRAVENOUS | Status: AC
  Filled 2022-01-30: qty 10

## 2022-01-30 MED ORDER — ACETAMINOPHEN 500 MG PO TABS
ORAL_TABLET | ORAL | Status: AC
  Filled 2022-01-30: qty 2

## 2022-01-30 MED ORDER — FENTANYL CITRATE PF 50 MCG/ML IJ SOSY
25.0000 ug | PREFILLED_SYRINGE | INTRAMUSCULAR | Status: DC | PRN
  Administered 2022-01-30 (×3): 50 ug via INTRAVENOUS

## 2022-01-30 MED ORDER — PHENYLEPHRINE 40 MCG/ML (10ML) SYRINGE FOR IV PUSH (FOR BLOOD PRESSURE SUPPORT)
PREFILLED_SYRINGE | INTRAVENOUS | Status: DC | PRN
  Administered 2022-01-30 (×3): 80 ug via INTRAVENOUS

## 2022-01-30 MED ORDER — POLYETHYLENE GLYCOL 3350 17 GM/SCOOP PO POWD: 1.0000 | Freq: Once | ORAL | Status: DC

## 2022-01-30 MED ORDER — LIDOCAINE 2% (20 MG/ML) 5 ML SYRINGE
INTRAMUSCULAR | Status: DC | PRN
  Administered 2022-01-30: 80 mg via INTRAVENOUS

## 2022-01-30 MED ORDER — ROCURONIUM BROMIDE 10 MG/ML (PF) SYRINGE
PREFILLED_SYRINGE | INTRAVENOUS | Status: DC | PRN
  Administered 2022-01-30: 40 mg via INTRAVENOUS
  Administered 2022-01-30: 80 mg via INTRAVENOUS
  Administered 2022-01-30: 20 mg via INTRAVENOUS

## 2022-01-30 MED ORDER — DOCUSATE SODIUM 100 MG PO CAPS: 100.0000 mg | ORAL_CAPSULE | Freq: Two times a day (BID) | ORAL | Status: DC

## 2022-01-30 MED ORDER — PROPOFOL 10 MG/ML IV BOLUS
INTRAVENOUS | Status: DC | PRN
  Administered 2022-01-30: 200 mg via INTRAVENOUS

## 2022-01-30 MED ORDER — ACETAMINOPHEN 500 MG PO TABS
1000.0000 mg | ORAL_TABLET | Freq: Four times a day (QID) | ORAL | Status: DC
  Administered 2022-01-30 – 2022-02-02 (×8): 1000 mg via ORAL
  Filled 2022-01-30 (×10): qty 2

## 2022-01-30 MED ORDER — ORAL CARE MOUTH RINSE: 15.0000 mL | Freq: Once | OROMUCOSAL | Status: AC

## 2022-01-30 MED ORDER — CEFAZOLIN SODIUM-DEXTROSE 2-4 GM/100ML-% IV SOLN
2.0000 g | INTRAVENOUS | Status: AC
  Administered 2022-01-30: 2 g via INTRAVENOUS
  Filled 2022-01-30: qty 100

## 2022-01-30 MED ORDER — LACTATED RINGERS IV SOLN: INTRAVENOUS | Status: DC

## 2022-01-30 MED ORDER — LIDOCAINE HCL (PF) 2 % IJ SOLN
INTRAMUSCULAR | Status: AC
Start: 2022-01-30 — End: ?
  Filled 2022-01-30: qty 20

## 2022-01-30 MED ORDER — DOCUSATE SODIUM 100 MG PO CAPS
100.0000 mg | ORAL_CAPSULE | Freq: Two times a day (BID) | ORAL | Status: DC
  Administered 2022-01-30 – 2022-02-01 (×6): 100 mg via ORAL
  Filled 2022-01-30 (×6): qty 1

## 2022-01-30 MED ORDER — SODIUM CHLORIDE (PF) 0.9 % IJ SOLN
INTRAMUSCULAR | Status: AC
Start: 2022-01-30 — End: ?
  Filled 2022-01-30: qty 20

## 2022-01-30 MED ORDER — DIPHENHYDRAMINE HCL 50 MG/ML IJ SOLN: 12.5000 mg | Freq: Four times a day (QID) | INTRAMUSCULAR | Status: DC | PRN

## 2022-01-30 MED ORDER — FENTANYL CITRATE (PF) 250 MCG/5ML IJ SOLN
INTRAMUSCULAR | Status: AC
  Filled 2022-01-30: qty 5

## 2022-01-30 MED ORDER — HYDROCODONE-ACETAMINOPHEN 5-325 MG PO TABS
1.0000 | ORAL_TABLET | Freq: Four times a day (QID) | ORAL | 0 refills | Status: DC | PRN
  Filled 2022-01-30: qty 20, 3d supply, fill #0

## 2022-01-30 MED ORDER — MIDAZOLAM HCL 5 MG/5ML IJ SOLN
INTRAMUSCULAR | Status: DC | PRN
Start: 2022-01-30 — End: 2022-01-30
  Administered 2022-01-30: 2 mg via INTRAVENOUS

## 2022-01-30 MED ORDER — CHLORHEXIDINE GLUCONATE CLOTH 2 % EX PADS
6.0000 | MEDICATED_PAD | Freq: Every day | CUTANEOUS | Status: DC
  Administered 2022-01-31 – 2022-02-01 (×2): 6 via TOPICAL

## 2022-01-30 MED ORDER — HYDROMORPHONE HCL 1 MG/ML IJ SOLN
INTRAMUSCULAR | Status: AC
  Filled 2022-01-30: qty 1

## 2022-01-30 MED ORDER — HYDROMORPHONE HCL 1 MG/ML IJ SOLN
0.2500 mg | INTRAMUSCULAR | Status: DC | PRN
  Administered 2022-01-30 (×2): 0.5 mg via INTRAVENOUS

## 2022-01-30 MED ORDER — DEXTROSE-NACL 5-0.45 % IV SOLN: INTRAVENOUS | Status: DC

## 2022-01-30 MED ORDER — ONDANSETRON HCL 4 MG/2ML IJ SOLN
INTRAMUSCULAR | Status: AC
  Filled 2022-01-30: qty 6

## 2022-01-30 MED ORDER — OXYCODONE HCL 5 MG PO TABS
5.0000 mg | ORAL_TABLET | ORAL | Status: DC | PRN
  Administered 2022-01-30: 5 mg via ORAL
  Filled 2022-01-30 (×2): qty 1

## 2022-01-30 MED ORDER — ONDANSETRON HCL 4 MG/2ML IJ SOLN
4.0000 mg | INTRAMUSCULAR | Status: DC | PRN
  Administered 2022-02-01 (×2): 4 mg via INTRAVENOUS
  Filled 2022-01-30 (×3): qty 2

## 2022-01-30 MED ORDER — STERILE WATER FOR IRRIGATION IR SOLN
Status: DC | PRN
  Administered 2022-01-30: 1000 mL

## 2022-01-30 MED ORDER — SUGAMMADEX SODIUM 200 MG/2ML IV SOLN
INTRAVENOUS | Status: DC | PRN
  Administered 2022-01-30: 400 mg via INTRAVENOUS

## 2022-01-30 MED ORDER — FENTANYL CITRATE (PF) 100 MCG/2ML IJ SOLN
INTRAMUSCULAR | Status: DC | PRN
  Administered 2022-01-30: 50 ug via INTRAVENOUS
  Administered 2022-01-30: 150 ug via INTRAVENOUS
  Administered 2022-01-30: 50 ug via INTRAVENOUS

## 2022-01-30 MED ORDER — PHENYLEPHRINE HCL-NACL 20-0.9 MG/250ML-% IV SOLN
INTRAVENOUS | Status: AC
Start: 2022-01-30 — End: 2022-01-30
  Filled 2022-01-30: qty 500

## 2022-01-30 MED ORDER — HYDROMORPHONE HCL 1 MG/ML IJ SOLN
0.5000 mg | INTRAMUSCULAR | Status: DC | PRN
  Administered 2022-01-30 – 2022-01-31 (×2): 0.5 mg via INTRAVENOUS
  Administered 2022-01-31 – 2022-02-01 (×2): 1 mg via INTRAVENOUS
  Filled 2022-01-30 (×4): qty 1

## 2022-01-30 MED ORDER — OXYCODONE HCL 5 MG PO TABS
10.0000 mg | ORAL_TABLET | ORAL | Status: DC | PRN
  Administered 2022-01-30 – 2022-02-02 (×11): 10 mg via ORAL
  Filled 2022-01-30 (×11): qty 2

## 2022-01-30 MED ORDER — BUPIVACAINE LIPOSOME 1.3 % IJ SUSP
INTRAMUSCULAR | Status: DC | PRN
  Administered 2022-01-30: 20 mL

## 2022-01-30 MED ORDER — BACITRACIN-NEOMYCIN-POLYMYXIN 400-5-5000 EX OINT: 1.0000 "application " | TOPICAL_OINTMENT | Freq: Three times a day (TID) | CUTANEOUS | Status: DC | PRN

## 2022-01-30 MED ORDER — OXYCODONE HCL 5 MG PO TABS
5.0000 mg | ORAL_TABLET | Freq: Once | ORAL | Status: AC | PRN
  Administered 2022-01-30: 5 mg via ORAL

## 2022-01-30 MED ORDER — SODIUM CHLORIDE (PF) 0.9 % IJ SOLN
INTRAMUSCULAR | Status: DC | PRN
  Administered 2022-01-30: 20 mL

## 2022-01-30 MED ORDER — DEXAMETHASONE SODIUM PHOSPHATE 10 MG/ML IJ SOLN
INTRAMUSCULAR | Status: AC
  Filled 2022-01-30: qty 3

## 2022-01-30 SURGICAL SUPPLY — 59 items
BAG COUNTER SPONGE SURGICOUNT (BAG) ×2 IMPLANT
BAG LAPAROSCOPIC 12 15 PORT 16 (BASKET) ×1 IMPLANT
BAG RETRIEVAL 12/15 (BASKET) ×2
CHLORAPREP W/TINT 26 (MISCELLANEOUS) ×2 IMPLANT
CLIP LIGATING HEM O LOK PURPLE (MISCELLANEOUS) ×2 IMPLANT
CLIP LIGATING HEMO LOK XL GOLD (MISCELLANEOUS) ×2 IMPLANT
CLIP LIGATING HEMO O LOK GREEN (MISCELLANEOUS) ×2 IMPLANT
COVER SURGICAL LIGHT HANDLE (MISCELLANEOUS) ×2 IMPLANT
COVER TIP SHEARS 8 DVNC (MISCELLANEOUS) ×1 IMPLANT
COVER TIP SHEARS 8MM DA VINCI (MISCELLANEOUS) ×1
CUTTER ECHEON FLEX ENDO 45 340 (ENDOMECHANICALS) ×1 IMPLANT
DERMABOND ADVANCED (GAUZE/BANDAGES/DRESSINGS) ×1
DERMABOND ADVANCED .7 DNX12 (GAUZE/BANDAGES/DRESSINGS) ×2 IMPLANT
DRAIN CHANNEL 15F RND FF 3/16 (WOUND CARE) IMPLANT
DRAPE ARM DVNC X/XI (DISPOSABLE) ×4 IMPLANT
DRAPE COLUMN DVNC XI (DISPOSABLE) ×1 IMPLANT
DRAPE DA VINCI XI ARM (DISPOSABLE) ×4
DRAPE DA VINCI XI COLUMN (DISPOSABLE) ×1
DRAPE INCISE IOBAN 66X45 STRL (DRAPES) ×2 IMPLANT
DRAPE SHEET LG 3/4 BI-LAMINATE (DRAPES) ×2 IMPLANT
ELECT PENCIL ROCKER SW 15FT (MISCELLANEOUS) ×2 IMPLANT
ELECT REM PT RETURN 15FT ADLT (MISCELLANEOUS) ×2 IMPLANT
EVACUATOR SILICONE 100CC (DRAIN) IMPLANT
GLOVE SURG ENC MOIS LTX SZ6.5 (GLOVE) ×2 IMPLANT
GLOVE SURG ENC TEXT LTX SZ7.5 (GLOVE) ×4 IMPLANT
GOWN STRL REUS W/TWL LRG LVL3 (GOWN DISPOSABLE) ×6 IMPLANT
HOLDER FOLEY CATH W/STRAP (MISCELLANEOUS) ×2 IMPLANT
IRRIG SUCT STRYKERFLOW 2 WTIP (MISCELLANEOUS) ×2
IRRIGATION SUCT STRKRFLW 2 WTP (MISCELLANEOUS) ×1 IMPLANT
KIT BASIN OR (CUSTOM PROCEDURE TRAY) ×2 IMPLANT
KIT TURNOVER KIT A (KITS) ×1 IMPLANT
LOOP VESSEL MAXI BLUE (MISCELLANEOUS) ×1 IMPLANT
NDL INSUFFLATION 14GA 120MM (NEEDLE) ×1 IMPLANT
NEEDLE INSUFFLATION 14GA 120MM (NEEDLE) ×2 IMPLANT
PENCIL SMOKE EVACUATOR (MISCELLANEOUS) IMPLANT
PORT ACCESS TROCAR AIRSEAL 12 (TROCAR) ×1 IMPLANT
PORT ACCESS TROCAR AIRSEAL 5M (TROCAR) ×1
PROTECTOR NERVE ULNAR (MISCELLANEOUS) ×4 IMPLANT
RELOAD STAPLE 45 2.6 WHT THIN (STAPLE) IMPLANT
SEAL CANN UNIV 5-8 DVNC XI (MISCELLANEOUS) ×4 IMPLANT
SEAL XI 5MM-8MM UNIVERSAL (MISCELLANEOUS) ×4
SET TRI-LUMEN FLTR TB AIRSEAL (TUBING) ×2 IMPLANT
SOLUTION ELECTROLUBE (MISCELLANEOUS) ×2 IMPLANT
SPIKE FLUID TRANSFER (MISCELLANEOUS) ×2 IMPLANT
SPONGE T-LAP 4X18 ~~LOC~~+RFID (SPONGE) ×2 IMPLANT
STAPLE RELOAD 45 WHT (STAPLE) ×2 IMPLANT
STAPLE RELOAD 45MM WHITE (STAPLE) ×4
SUT ETHILON 3 0 PS 1 (SUTURE) IMPLANT
SUT MNCRL AB 4-0 PS2 18 (SUTURE) ×4 IMPLANT
SUT PDS AB 1 CT1 27 (SUTURE) ×7 IMPLANT
SUT VICRYL 0 UR6 27IN ABS (SUTURE) IMPLANT
TOWEL OR 17X26 10 PK STRL BLUE (TOWEL DISPOSABLE) ×2 IMPLANT
TOWEL OR NON WOVEN STRL DISP B (DISPOSABLE) ×2 IMPLANT
TRAY FOLEY MTR SLVR 16FR STAT (SET/KITS/TRAYS/PACK) ×2 IMPLANT
TRAY LAPAROSCOPIC (CUSTOM PROCEDURE TRAY) ×2 IMPLANT
TROCAR BLADELESS OPT 5 100 (ENDOMECHANICALS) IMPLANT
TROCAR UNIVERSAL OPT 12M 100M (ENDOMECHANICALS) ×2 IMPLANT
TROCAR XCEL 12X100 BLDLESS (ENDOMECHANICALS) ×2 IMPLANT
WATER STERILE IRR 1000ML POUR (IV SOLUTION) ×2 IMPLANT

## 2022-01-30 NOTE — Anesthesia Postprocedure Evaluation (Signed)
Anesthesia Post Note  Patient: Richard [name]  Procedure(s) Performed: XI ROBOTIC ASSISTED LAPAROSCOPIC NEPHRECTOMY (Right)     Patient location during evaluation: PACU Anesthesia Type: General Level of consciousness: awake and alert Pain management: pain level controlled Vital Signs Assessment: post-procedure vital signs reviewed and stable Respiratory status: spontaneous breathing, nonlabored ventilation and respiratory function stable Cardiovascular status: blood pressure returned to baseline and stable Postop Assessment: no apparent nausea or vomiting Anesthetic complications: no   No notable events documented.  Last Vitals:  Vitals:   01/30/22 1100 01/30/22 1216  BP: (!) 157/101 (!) 161/92  Pulse: 60 70  Resp: (!) 9 20  Temp: 36.4 C 36.7 C  SpO2: 100% 99%    Last Pain:  Vitals:   01/30/22 1448  TempSrc:   PainSc: 7                  Lidia Collum

## 2022-01-30 NOTE — Anesthesia Preprocedure Evaluation (Addendum)
Anesthesia Evaluation  Patient identified by MRN, date of birth, ID band Patient awake    Reviewed: Allergy & Precautions, NPO status , Patient's Chart, lab work & pertinent test results  History of Anesthesia Complications Negative for: history of anesthetic complications  Airway Mallampati: I  TM Distance: >3 FB Neck ROM: Full    Dental  (+) Dental Advisory Given, Teeth Intact   Pulmonary neg pulmonary ROS,    Pulmonary exam normal        Cardiovascular negative cardio ROS Normal cardiovascular exam     Neuro/Psych negative neurological ROS     GI/Hepatic negative GI ROS, Neg liver ROS,   Endo/Other  negative endocrine ROS  Renal/GU Renal disease (R renal mass)  negative genitourinary   Musculoskeletal  (+) Arthritis ,   Abdominal   Peds  Hematology negative hematology ROS (+)   Anesthesia Other Findings   Reproductive/Obstetrics                            Anesthesia Physical Anesthesia Plan  ASA: 2  Anesthesia Plan: General   Post-op Pain Management: Tylenol PO (pre-op)*, Toradol IV (intra-op)* and Lidocaine infusion*   Induction: Intravenous  PONV Risk Score and Plan: 3 and Ondansetron, Dexamethasone, Treatment may vary due to age or medical condition and Midazolam  Airway Management Planned: Oral ETT  Additional Equipment: None  Intra-op Plan:   Post-operative Plan: Extubation in OR  Informed Consent: I have reviewed the patients History and Physical, chart, labs and discussed the procedure including the risks, benefits and alternatives for the proposed anesthesia with the patient or authorized representative who has indicated his/her understanding and acceptance.     Dental advisory given  Plan Discussed with:   Anesthesia Plan Comments:         Anesthesia Quick Evaluation

## 2022-01-30 NOTE — Op Note (Signed)
NAME: Richard [name], Richard [name]. MEDICAL RECORD NO: [id number] ACCOUNT NO: [id number] DATE OF BIRTH: [birth year] FACILITY: [facility] LOCATION: [facility] PHYSICIAN: Alexis Frock, MD  Operative Report   DATE OF PROCEDURE: 01/30/2022  PREOPERATIVE DIAGNOSIS:  Large right renal mass.  PROCEDURE:  Right robotic radical nephrectomy.  BLOOD LOSS: 100 mL.  COMPLICATIONS:  None.  SPECIMEN:  Right kidney plus adrenal en bloc for permanent pathology.  FINDINGS: 1.  Single artery, single vein, right renovascular anatomy. 2.  Large right upper pole renal mass.  ASSISTANT:  Debbrah Alar, PA.  DRAINS:  Foley catheter to straight drain.  INDICATIONS:  The patient is a very pleasant 36 year old man who was found to have a large right enhancing renal mass on evaluation of hematuria. He underwent additional staging imaging with a chest CT and MRI abdomen, which ruled out pulmonary  metastasis or obvious large renal vein or IVC involvement.  Options were discussed including recommended path of right radical nephrectomy and he wished to proceed.  Informed consent was obtained and placed in medical record.  PROCEDURE DETAILS:  The patient is being identified and verified, procedure being right radical nephrectomy was confirmed.  Procedure timeout was performed.  Intravenous antibiotics were administered.  General endotracheal anesthesia induced.  The  patient was placed into the right side up full flank position and pulling 15 degrees of table flexion, superior arm elevator axillary roll sequential compression devices, bottom leg bent, top leg straight.  Foley catheter was placed per urethra for  straight drain.  He was further fastened up to operating table using 3-inch tape, over foam padding across the supraxiphoid chest and his pelvis.  Beanbag was deployed.  Sterile field was created using chlorhexidine gluconate after clipper shaving.   Next, a high flow, pressure, pneumoperitoneum was obtained using Veress  technique, right lower quadrant, having passed the aspiration and drop test.  An 8 mm robotic camera port was then placed in position approximately 1.5 handbreadths superolateral to  the umbilicus.  Laparoscopic examination peritoneal cavity revealed no significant adhesions and no visceral injury.  There was already obvious large mass effect from the upper pole renal mass.  Additional ports were placed as follows:  Right paramedian  inferior robotic port, approximately 1 handbreadth superior to the pubic ramus, right far lateral robotic port approximately 1 handbreadth superior medial to the anterior superior iliac spine, right subcostal 8 mm robotic port.  Two 12 mm assistant port  sites to midline.  1 approximately 2 fingerbreadths inferior to the planned camera port, another 2 fingerbreadths superior and a 5 mm port in the subxiphoid location through which a self-locking grasper was placed to retract the inferior edge of the  liver superior away from the anterior surface of Gerota's fascia and Robot was then docked and passed the electronic checks.  Initial attention was directed at development of the retroperitoneum.  Incision was made lateral to the ascending colon from the  area of the cecum towards the area of the hepatic flexure.  This was carefully mobilized medially.  Duodenum was encountered and kocherized medially such that it lay medial to the lateral border of the inferior vena cava.  Lower pole of the kidney area  was placed on gentle lateral traction.  Dissection proceeded medial to this.  The ureter and gonadal vessels were encountered.  Gonadal vessels were allowed to fall medially.  Ureter was kept lateral.  Dissection proceeded within this triangle of the  ureter and psoas musculature towards the area of the renal hilum.  Renal hilum consisted of a single artery, single vein, right renovascular anatomy was anticipated.  The vein was broad based with some mass effect from the mass, artery  was controlled  using extra-large Hem-o-lok clip proximal stapler distal and the vein was controlled using vascular stapler.  Given the upper pole location of the mass at his young age, it was felt that full adrenal resection was warranted.  As such, a plane was chosen  superior to the hilum.  This completely removed the plane between the adrenal and IVC. This was very carefully developed with small adrenal perforators controlled using coagulation.  There was no obvious periaortic adenopathy.  The kidney plus adrenal  specimen was then placed on the inferior traction and the superior attachments were taken down using cautery scissors as were the lateral attachments. Ureter were doubly clipped and ligated and this completely freed up the large radical nephrectomy  specimen, this was placed into the extra-large EndoCatch bag.  Sponge and needle counts were correct.  Hemostasis was excellent.  The liver retractor was taken down. Liver was uninjured.  Specimen was retrieved by extending the previous assistant port  site in the midline and removing the large right kidney with adrenal en bloc specimen setting aside for permanent pathology.  Extraction site was closed with the fascia using figure-of-eight PDS x8 followed by reapproximation of Scarpa's with running  Vicryl.  All incision sites were infiltrated with dilute lipolyzed Marcaine and closed at the level skin using subcuticular Monocryl followed by Dermabond.  Procedure terminated.  The patient tolerated the procedure well, no immediate perioperative  complications.  The patient taken to postanesthesia care in stable condition.  First assistant, Debbrah Alar, was crucial for all portions of the surgery today.  She provided invaluable retraction, suctioning, vascular clipping, vascular stapling, specimen manipulation, and general first assistance.   PUS D: 01/30/2022 9:35:09 am T: 01/30/2022 10:16:00 am  JOB: [id number]/ [id number]

## 2022-01-30 NOTE — Anesthesia Procedure Notes (Signed)
Procedure Name: Intubation Date/Time: 01/30/2022 7:58 AM Performed by: Lavina Hamman, CRNA Pre-anesthesia Checklist: Patient identified, Emergency Drugs available, Suction available, Patient being monitored and Timeout performed Patient Re-evaluated:Patient Re-evaluated prior to induction Oxygen Delivery Method: Circle system utilized Preoxygenation: Pre-oxygenation with 100% oxygen Induction Type: IV induction Ventilation: Mask ventilation without difficulty Laryngoscope Size: Mac and 4 Grade View: Grade II Tube type: Oral Tube size: 7.0 mm Number of attempts: 1 Airway Equipment and Method: Stylet Placement Confirmation: ETT inserted through vocal cords under direct vision, positive ETCO2, CO2 detector and breath sounds checked- equal and bilateral Secured at: 23 cm Tube secured with: Tape Dental Injury: Teeth and Oropharynx as per pre-operative assessment  Comments: ATOI

## 2022-01-30 NOTE — Transfer of Care (Deleted)
.  tran

## 2022-01-30 NOTE — Brief Op Note (Signed)
01/30/2022  9:28 AM  PATIENT:  Richard [name]  36 y.o. male  PRE-OPERATIVE DIAGNOSIS:  RIGHT RENAL MASS  POST-OPERATIVE DIAGNOSIS:  RIGHT RENAL MASS  PROCEDURE:  Procedure(s): XI ROBOTIC ASSISTED LAPAROSCOPIC NEPHRECTOMY (Right)  SURGEON:  Surgeon(s) and Role:    Alexis Frock, MD - Primary  PHYSICIAN ASSISTANT:   ASSISTANTS: Clemetine Marker PA   ANESTHESIA:   local and general  EBL:  100 mL   BLOOD ADMINISTERED:none  DRAINS:  foley to gravity    LOCAL MEDICATIONS USED:  MARCAINE     SPECIMEN:  Source of Specimen:  Rt kidney + Adrenal  DISPOSITION OF SPECIMEN:  PATHOLOGY  COUNTS:  YES  TOURNIQUET:  * No tourniquets in log *  DICTATION: .Other Dictation: Dictation Number [id number]  PLAN OF CARE: Admit to inpatient   PATIENT DISPOSITION:  PACU - hemodynamically stable.   Delay start of Pharmacological VTE agent (>24hrs) due to surgical blood loss or risk of bleeding: not applicable

## 2022-01-30 NOTE — Discharge Instructions (Addendum)
1- Drain Sites - You may have some mild persistent drainage from old drain site for several days, this is normal. This can be covered with cotton gauze for convenience.  2 - Stiches - Your stitches are all dissolvable. You may notice a "loose thread" at your incisions, these are normal and require no intervention. You may cut them flush to the skin with fingernail clippers if needed for comfort.  3 - Diet - No restrictions  4 - Activity - No heavy lifting / straining (any activities that require valsalva or "bearing down") x 4 weeks. Otherwise, no restrictions.  5 - Bathing - You may shower immediately. Do not take a bath or get into swimming pool where incision sites are submersed in water x 4 weeks.   6 -  When to Call the Doctor - Call MD for any fever >102, any acute wound problems, or any severe nausea / vomiting. You can call the [facility] [phone]) 24 hours a day 365 days a year. It will roll-over to the answering service and on-call physician after hours.    You may resume celebrex, aspirin, advil, aleve, vitamins, and supplements 7 days after surgery. You may resume Voltaren gel immediately.

## 2022-01-30 NOTE — Progress Notes (Signed)
°   01/30/22 1830  Mobility  Activity Ambulated with assistance in hallway  Range of Motion/Exercises Active;All extremities  Level of Assistance Contact guard assist, steadying assist  Assistive Device None  Distance Ambulated (ft) 300 ft  Activity Response Tolerated well   Patient ambulated 300 ft in the hallway and tolerated well. Tolerating clear liquids well, no complaints of nausea/vomiting.  Layla Maw, RN

## 2022-01-30 NOTE — Transfer of Care (Signed)
Immediate Anesthesia Transfer of Care Note  Patient: Richard [name]  Procedure(s) Performed: Procedure(s): XI ROBOTIC ASSISTED LAPAROSCOPIC NEPHRECTOMY (Right)  Patient Location: PACU  Anesthesia Type:General  Level of Consciousness:  sedated, patient cooperative and responds to stimulation  Airway & Oxygen Therapy:Patient Spontanous Breathing and Patient connected to face mask oxgen  Post-op Assessment:  Report given to PACU RN and Post -op Vital signs reviewed and stable  Post vital signs:  Reviewed and stable  Last Vitals:  Vitals:   01/30/22 0552  BP: (!) 152/93  Pulse: 80  Resp: 15  Temp: 36.6 C  SpO2: 174%    Complications: No apparent anesthesia complications

## 2022-01-30 NOTE — H&P (Signed)
Richard [name] is an 36 y.o. male.    Chief Complaint: Pre-OP RIGHT Radical Nephrectomy  HPI:   1 - Large RIGHT Renal Mass - 10cm Rt upper mass with sinus infiltration (appears cortical origin) on eval gross hematuria 2023. 1 artery / 1 vein right renovascualr anatomy. CT chest and MRI w/o overt metastatic disease or vein invovlment  ?PMG sig for borderline HTN, mild obesity, ENT survery. NO CV disease / blood thinners. Works in Pharmacologist. lots of regional clients. His wife Richard [name] is very involved and works for on [employer]. His PCP is Richard Bonus MD.  ?Today " Richard [name] " is seen to proceed with RIGHT robotic radical nephrectomy. C19 screen negative, Hgb 15.    Past Medical History:  Diagnosis Date   Stress fracture, right ankle, sequela    Subluxation of extensor carpi ulnaris tendon, right, sequela     Past Surgical History:  Procedure Laterality Date   TONSILLECTOMY AND ADENOIDECTOMY  1996   WISDOM TOOTH EXTRACTION      Family History  Problem Relation Age of Onset   High blood pressure Mother    High Cholesterol Mother    Arthritis-Osteo Father    Hearing loss Father    High blood pressure Father    High Cholesterol Father    Asthma Sister    Hearing loss Maternal Grandmother    Heart disease Maternal Grandmother    High blood pressure Maternal Grandmother    High Cholesterol Maternal Grandmother    Stroke Maternal Grandmother 80       TIAs   Heart attack Maternal Grandfather 81   Cancer Paternal Grandmother        ?lung; smoker   Hearing loss Paternal Grandfather    Bladder Cancer Paternal Uncle    Bladder Cancer Paternal Uncle    Heart Problems Maternal Uncle        ?left ventricular issues   Other Maternal Uncle        cardiac disease   Social History:  reports that he has never smoked. He has never used smokeless tobacco. He reports current alcohol use. He reports that he does not use drugs.  Allergies:  Allergies  Allergen Reactions   Prednisone  Palpitations    Racing heart    Medications Prior to Admission  Medication Sig Dispense Refill   celecoxib (CELEBREX) 200 MG capsule Take 1 capsule (200 mg total) by mouth 2 (two) times daily. (Patient taking differently: Take 200 mg by mouth 2 (two) times daily as needed for moderate pain.) 180 capsule 0   Diclofenac Sodium (PENNSAID) 2 % SOLN Apply 2 g topically 2 (two) times daily as needed (to affected area). 112 g 3   fluticasone (FLONASE) 50 MCG/ACT nasal spray Place 1 spray into both nostrils daily. 16 g 0   tadalafil (CIALIS) 20 MG tablet TAKE 1 TABLET BY MOUTH DAILY AS NEEDED 30 tablet 5   COVID-19 At Home Antigen Test KIT Use as directed. (Patient not taking: Reported on 01/06/2022) 2 kit 0   COVID-19 At Home Antigen Test KIT Use as directed. 2 kit 0   COVID-19 mRNA bivalent vaccine, Pfizer, (PFIZER COVID-19 VAC BIVALENT) injection Inject into the muscle. (Patient not taking: Reported on 01/06/2022) 0.3 mL 0   diazepam (VALIUM) 10 MG tablet 1 tablet PO 30 min prior to MRI 1 tablet 0   influenza vac split quadrivalent PF (FLUARIX) 0.5 ML injection Inject into the muscle. (Patient not taking: Reported on 01/06/2022) 0.5 mL 0  Multiple Vitamin (MULTIVITAMIN ADULT PO) Take by mouth daily. (Patient not taking: Reported on 01/06/2022)      No results found for this or any previous visit (from the past 48 hour(s)). No results found.  Review of Systems  Constitutional:  Negative for chills and fever.  Genitourinary:  Positive for hematuria.  All other systems reviewed and are negative.  There were no vitals taken for this visit. Physical Exam Vitals reviewed.  HENT:     Head: Normocephalic.     Nose: Nose normal.  Eyes:     Pupils: Pupils are equal, round, and reactive to light.  Cardiovascular:     Rate and Rhythm: Normal rate.  Pulmonary:     Effort: Pulmonary effort is normal.  Abdominal:     General: Abdomen is flat.     Comments: Stable truncal obesity  Genitourinary:     Comments: No CVAT at present Musculoskeletal:        General: Normal range of motion.     Cervical back: Normal range of motion.  Neurological:     General: No focal deficit present.     Mental Status: He is alert.  Psychiatric:        Mood and Affect: Mood normal.     Assessment/Plan  Proceed as planned with RIGHT robotic radical nephrectomy. Risks, benefits, alternatives, expected peri-op course discussed prviously and reiterated today.,   Alexis Frock, MD 01/30/2022, 5:40 AM

## 2022-01-31 ENCOUNTER — Other Ambulatory Visit (HOSPITAL_COMMUNITY): Payer: Self-pay

## 2022-01-31 ENCOUNTER — Encounter (HOSPITAL_COMMUNITY): Payer: Self-pay | Admitting: Urology

## 2022-01-31 LAB — BASIC METABOLIC PANEL
Anion gap: 7 (ref 5–15)
BUN: 14 mg/dL (ref 6–20)
CO2: 27 mmol/L (ref 22–32)
Calcium: 8.7 mg/dL — ABNORMAL LOW (ref 8.9–10.3)
Chloride: 98 mmol/L (ref 98–111)
Creatinine, Ser: 1.44 mg/dL — ABNORMAL HIGH (ref 0.61–1.24)
GFR, Estimated: >60 mL/min (ref >60)
Glucose, Bld: 109 mg/dL — ABNORMAL HIGH (ref 70–99)
Potassium: 3.4 mmol/L — ABNORMAL LOW (ref 3.5–5.1)
Sodium: 132 mmol/L — ABNORMAL LOW (ref 135–145)

## 2022-01-31 LAB — HEMOGLOBIN AND HEMATOCRIT, BLOOD
HCT: 40 % (ref 39.0–52.0)
Hemoglobin: 13.6 g/dL (ref 13.0–17.0)

## 2022-01-31 NOTE — Plan of Care (Addendum)
Pt pain well managed with PRN medications. Pt adequate UOP. Foley in place. SCDs in use. Pt tolerating clear liquids with no nausea/vomiting. NSR on the monitor on RA. Denies any further needs at this time. Pt was able to ambulate in the hall with standby assistance and well managed pain.

## 2022-01-31 NOTE — Progress Notes (Signed)
1 Day Post-Op Subjective: Patient reports good pain control. Negative flatus but patient is hungry. Creatinine 1.44. patient ambulating without difficulty  Objective: Vital signs in last 24 hours: Temp:  [98.6 F (37 C)-99.1 F (37.3 C)] 98.7 F (37.1 C) (02/25 0818) Pulse Rate:  [82-94] 85 (02/25 0818) Resp:  [14-20] 15 (02/25 0818) BP: (142-161)/(75-98) 142/82 (02/25 0818) SpO2:  [95 %-98 %] 95 % (02/25 0818)  Intake/Output from previous day: 02/24 0701 - 02/25 0700 In: 4257.5 [P.O.:580; I.V.:3677.5] Out: 3050 [Urine:2950; Blood:100] Intake/Output this shift: Total I/O In: -  Out: 400 [Urine:400]  Physical Exam:  General:alert, cooperative, and appears stated age GI: soft, non tender, normal bowel sounds, no palpable masses, no organomegaly, no inguinal hernia Male genitalia: not done Extremities: extremities normal, atraumatic, no cyanosis or edema  Lab Results: Recent Labs    01/30/22 1037 01/31/22 0340  HGB 14.0 13.6  HCT 40.6 40.0   BMET Recent Labs    01/31/22 0340  NA 132*  K 3.4*  CL 98  CO2 27  GLUCOSE 109*  BUN 14  CREATININE 1.44*  CALCIUM 8.7*   No results for input(s): LABPT, INR in the last 72 hours. No results for input(s): LABURIN in the last 72 hours. Results for orders placed or performed during the hospital encounter of 01/30/22  SARS CORONAVIRUS 2 (TAT 6-24 HRS) Nasopharyngeal Nasopharyngeal Swab     Status: None   Collection Time: 01/27/22  7:33 AM   Specimen: Nasopharyngeal Swab  Result Value Ref Range Status   SARS Coronavirus 2 NEGATIVE NEGATIVE Final    Comment: (NOTE) SARS-CoV-2 target nucleic acids are NOT DETECTED.  The SARS-CoV-2 RNA is generally detectable in upper and lower respiratory specimens during the acute phase of infection. Negative results do not preclude SARS-CoV-2 infection, do not rule out co-infections with other pathogens, and should not be used as the sole basis for treatment or other patient management  decisions. Negative results must be combined with clinical observations, patient history, and epidemiological information. The expected result is Negative.  Fact Sheet for Patients: SugarRoll.be  Fact Sheet for Healthcare Providers: https://www.woods-mathews.com/  This test is not yet approved or cleared by the [place] FDA and  has been authorized for detection and/or diagnosis of SARS-CoV-2 by FDA under an Emergency Use Authorization (EUA). This EUA will remain  in effect (meaning this test can be used) for the duration of the COVID-19 declaration under Se ction 564(b)(1) of the Act, 21 U.S.C. section 360bbb-3(b)(1), unless the authorization is terminated or revoked sooner.  Performed at [facility], [place] [address] [place], [place] [address]     Studies/Results: No results found.  Assessment/Plan: POD#1 Right radical nephrectomy Advance diet to regular D/c foley catheter Continue current pain control regiment Likely discharge tomorrow   LOS: 1 day   [name] 01/31/2022, 1:41 PM

## 2022-02-01 LAB — GLUCOSE, CAPILLARY: Glucose-Capillary: 126 mg/dL — ABNORMAL HIGH (ref 70–99)

## 2022-02-01 MED ORDER — ZOLPIDEM TARTRATE 5 MG PO TABS: 5.0000 mg | ORAL_TABLET | Freq: Every evening | ORAL | Status: DC | PRN

## 2022-02-01 MED ORDER — BISACODYL 10 MG RE SUPP
10.0000 mg | Freq: Once | RECTAL | Status: AC
  Administered 2022-02-01: 10 mg via RECTAL
  Filled 2022-02-01: qty 1

## 2022-02-01 MED ORDER — PROMETHAZINE HCL 25 MG PO TABS
12.5000 mg | ORAL_TABLET | Freq: Four times a day (QID) | ORAL | Status: DC | PRN
  Administered 2022-02-01 (×2): 12.5 mg via ORAL
  Filled 2022-02-01 (×2): qty 1

## 2022-02-01 NOTE — Progress Notes (Signed)
2 Days Post-Op Subjective: Patient reports good pain control. Positive flatus but patient is more distended today. Vomiting overnight. patient ambulating without difficulty  Objective: Vital signs in last 24 hours: Temp:  [97.6 F (36.4 C)-98.9 F (37.2 C)] 97.6 F (36.4 C) (02/26 1348) Pulse Rate:  [84-95] 84 (02/26 1348) Resp:  [16-18] 18 (02/26 1348) BP: (132-147)/(81-89) 132/81 (02/26 1348) SpO2:  [94 %-96 %] 94 % (02/26 0413)  Intake/Output from previous day: 02/25 0701 - 02/26 0700 In: 1929.8 [P.O.:240; I.V.:1689.8] Out: 1400 [Urine:1200; Emesis/NG output:200] Intake/Output this shift: Total I/O In: 708.8 [I.V.:708.8] Out: 600 [Urine:600]  Physical Exam:  General:alert, cooperative, and appears stated age GI: soft, non tender, normal bowel sounds, no palpable masses, no organomegaly, no inguinal hernia Male genitalia: not done Extremities: extremities normal, atraumatic, no cyanosis or edema  Lab Results: Recent Labs    01/30/22 1037 01/31/22 0340  HGB 14.0 13.6  HCT 40.6 40.0   BMET Recent Labs    01/31/22 0340  NA 132*  K 3.4*  CL 98  CO2 27  GLUCOSE 109*  BUN 14  CREATININE 1.44*  CALCIUM 8.7*   No results for input(s): LABPT, INR in the last 72 hours. No results for input(s): LABURIN in the last 72 hours. Results for orders placed or performed during the hospital encounter of 01/30/22  SARS CORONAVIRUS 2 (TAT 6-24 HRS) Nasopharyngeal Nasopharyngeal Swab     Status: None   Collection Time: 01/27/22  7:33 AM   Specimen: Nasopharyngeal Swab  Result Value Ref Range Status   SARS Coronavirus 2 NEGATIVE NEGATIVE Final    Comment: (NOTE) SARS-CoV-2 target nucleic acids are NOT DETECTED.  The SARS-CoV-2 RNA is generally detectable in upper and lower respiratory specimens during the acute phase of infection. Negative results do not preclude SARS-CoV-2 infection, do not rule out co-infections with other pathogens, and should not be used as the sole  basis for treatment or other patient management decisions. Negative results must be combined with clinical observations, patient history, and epidemiological information. The expected result is Negative.  Fact Sheet for Patients: SugarRoll.be  Fact Sheet for Healthcare Providers: https://www.woods-mathews.com/  This test is not yet approved or cleared by the [place] FDA and  has been authorized for detection and/or diagnosis of SARS-CoV-2 by FDA under an Emergency Use Authorization (EUA). This EUA will remain  in effect (meaning this test can be used) for the duration of the COVID-19 declaration under Se ction 564(b)(1) of the Act, 21 U.S.C. section 360bbb-3(b)(1), unless the authorization is terminated or revoked sooner.  Performed at [facility], [place] [address] [place], [place] [address]     Studies/Results: No results found.  Assessment/Plan: POD#1 Right radical nephrectomy Dulcolax suppository daily Continue current pain control regiment Likely discharge tomorrow   LOS: 2 days   [name] 02/01/2022, 6:34 PM

## 2022-02-01 NOTE — Plan of Care (Addendum)
Pt has had difficulty overnight. Pt was able to ambulate in hallway twice. Requires dilaudid for pain control was unable to tolerate PO medications. Pt had 2 episodes of emesis, zofran given. Pt able to sit on [facility] with assistance and was able to pass flatus, continues belching. Pt distended, reports feeling gassy and extremely uncomfortable. Pt and spouse concerned about pending discharge.

## 2022-02-02 ENCOUNTER — Other Ambulatory Visit (HOSPITAL_COMMUNITY): Payer: Self-pay

## 2022-02-02 NOTE — Discharge Summary (Signed)
Physician Discharge Summary  Patient ID: Richard [name] MRN: [id number] DOB/AGE: 06-28-1986 36 y.o.  Admit date: 01/30/2022 Discharge date: 02/02/2022  Admission Diagnoses: RIGHT Renal Mass  Discharge Diagnoses:  Principal Problem:   Renal mass   Discharged Condition: good  Hospital Course: Pt underwent RIGHT robotic radical nephrectomy 01/30/22 for large mass. He was admitted to 4th floor Urology service post-op. By the AM of POD 3, the day of discharge, he is ambulatory, pain controlled on PO meds, maintaining PO nutrition, and felt to be adequate for discharge. Path pending, Cr 1.4, Hgb 13 at discharge.   Consults: None  Significant Diagnostic Studies: labs: as per above  Treatments: surgery: as per above  Discharge Exam: Blood pressure (!) 152/90, pulse 85, temperature 98.7 F (37.1 C), temperature source Oral, resp. rate 16, height 5' 11"  (1.803 m), weight 101.8 kg, SpO2 96 %.  NAD, AOx3, Wife at bedside Non-labored breathing on RA RRR SNTND with midline extraction and Rt sided port sites c/d/I with mild bruising NO c/c/e Small Rt flank simple contact blister (likely site of prior tape)  Disposition: HOME   Allergies as of 02/02/2022       Reactions   Prednisone Palpitations   Racing heart        Medication List     STOP taking these medications    celecoxib 200 MG capsule Commonly known as: CeleBREX   Diclofenac Sodium 2 % Soln Commonly known as: Pennsaid   MULTIVITAMIN ADULT PO       TAKE these medications    Carestart COVID-19 Home Test Kit Generic drug: COVID-19 At Home Antigen Test Use as directed.   Carestart COVID-19 Home Test Kit Generic drug: COVID-19 At Home Antigen Test Use as directed.   diazepam 10 MG tablet Commonly known as: VALIUM Take 1 tablet by mouth 30 min prior to MRI (1 tablet PO 30 min prior to MRI)   docusate sodium 100 MG capsule Commonly known as: COLACE Take 1 capsule (100 mg total) by mouth 2 (two) times  daily.   Fluarix Quadrivalent 0.5 ML injection Generic drug: influenza vac split quadrivalent PF Inject into the muscle.   fluticasone 50 MCG/ACT nasal spray Commonly known as: FLONASE Place 1 spray into both nostrils daily.   HYDROcodone-acetaminophen 5-325 MG tablet Commonly known as: Norco Take 1-2 tablets by mouth every 6 (six) hours as needed for moderate pain.   Pfizer COVID-19 Vac Bivalent injection Generic drug: COVID-19 mRNA bivalent vaccine Therapist, music) Inject into the muscle.   tadalafil 20 MG tablet Commonly known as: CIALIS TAKE 1 TABLET BY MOUTH DAILY AS NEEDED        Follow-up Information     Alexis Frock, MD Follow up on 02/17/2022.   Specialty: Urology Why: at 8:15 for MD vist and pathology review. Contact information: [address] [place] [address] [phone]                 Signed: Alexis Frock 02/02/2022, 7:51 AM

## 2022-02-03 LAB — SURGICAL PATHOLOGY

## 2022-02-04 ENCOUNTER — Encounter: Payer: Self-pay | Admitting: Family Medicine

## 2022-02-18 ENCOUNTER — Telehealth: Payer: Self-pay | Admitting: Genetic Counselor

## 2022-02-18 NOTE — Telephone Encounter (Signed)
Scheduled appt per 3/15 referral. Pt is aware of appt date and time. Pt is aware to arrive 15 mins prior to appt time and to bring and updated insurance card. Pt is aware of appt location.   ?

## 2022-03-24 ENCOUNTER — Other Ambulatory Visit: Payer: Self-pay | Admitting: Genetic Counselor

## 2022-03-24 ENCOUNTER — Inpatient Hospital Stay: Payer: No Typology Code available for payment source

## 2022-03-24 ENCOUNTER — Inpatient Hospital Stay: Payer: No Typology Code available for payment source | Attending: Genetic Counselor | Admitting: Genetic Counselor

## 2022-03-24 ENCOUNTER — Other Ambulatory Visit: Payer: Self-pay

## 2022-03-24 DIAGNOSIS — Z800 Family history of malignant neoplasm of digestive organs: Secondary | ICD-10-CM

## 2022-03-24 DIAGNOSIS — Z8052 Family history of malignant neoplasm of bladder: Secondary | ICD-10-CM | POA: Diagnosis not present

## 2022-03-24 DIAGNOSIS — Z1379 Encounter for other screening for genetic and chromosomal anomalies: Secondary | ICD-10-CM

## 2022-03-24 DIAGNOSIS — Z85528 Personal history of other malignant neoplasm of kidney: Secondary | ICD-10-CM | POA: Diagnosis not present

## 2022-03-24 DIAGNOSIS — Z8042 Family history of malignant neoplasm of prostate: Secondary | ICD-10-CM

## 2022-03-24 LAB — GENETIC SCREENING ORDER

## 2022-03-25 ENCOUNTER — Encounter: Payer: Self-pay | Admitting: Genetic Counselor

## 2022-03-25 NOTE — Progress Notes (Signed)
REFERRING PROVIDER: ?Alexis Frock, MD ?[address] ?[place],  [place] [address] ? ?PRIMARY PROVIDER:  ?Caren Macadam, MD ? ?PRIMARY REASON FOR VISIT:  ?1. Personal history of renal cell carcinoma   ?2. Family history of pancreatic cancer   ?3. Family history of bladder cancer   ?4. Family history of prostate cancer   ? ?HISTORY OF PRESENT ILLNESS:   ?Mr. Yablonski, a 36 y.o. male, was seen for a [facility] cancer genetics consultation at the request of Dr. Tresa Moore due to a personal and family history of cancer.  Mr. ELMS presents to clinic today to discuss the possibility of a hereditary predisposition to cancer, to discuss genetic testing, and to further clarify his future cancer risks, as well as potential cancer risks for family members.  ? 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 ? ?Past Medical History:  ?Diagnosis Date  ? Stress fracture, right ankle, sequela   ? Subluxation of extensor carpi ulnaris tendon, right, sequela   ? ? ?Past Surgical History:  ?Procedure Laterality Date  ? ROBOT ASSISTED LAPAROSCOPIC NEPHRECTOMY Right 01/30/2022  ? Procedure: XI ROBOTIC ASSISTED LAPAROSCOPIC NEPHRECTOMY;  Surgeon: Alexis Frock, MD;  Location: [facility];  Service: Urology;  Laterality: Right;  ? [place]  ? WISDOM TOOTH EXTRACTION    ? ? ?Social History  ? ?Socioeconomic History  ? Marital status: Married  ?  Spouse name: Not on file  ? Number of children: Not on file  ? Years of education: Not on file  ? Highest education level: Master's degree (e.g., MA, MS, MEng, MEd, MSW, MBA)  ?Occupational History  ? Not on file  ?Tobacco Use  ? Smoking status: Never  ? Smokeless tobacco: Never  ?Vaping Use  ? Vaping Use: Never used  ?Substance and Sexual Activity  ? Alcohol use: Yes  ?  Comment: social on weekend  ? Drug use: Never  ? Sexual activity: Yes  ?  Partners: Female  ?Other Topics Concern  ? Not on file  ?Social  History Narrative  ? Not on file  ? ?Social Determinants of Health  ? ?Financial Resource Strain: Low Risk   ? Difficulty of Paying Living Expenses: Not hard at all  ?Food Insecurity: No Food Insecurity  ? Worried About Charity fundraiser in the Last Year: Never true  ? Ran Out of Food in the Last Year: Never true  ?Transportation Needs: No Transportation Needs  ? Lack of Transportation (Medical): No  ? Lack of Transportation (Non-Medical): No  ?Physical Activity: Sufficiently Active  ? Days of Exercise per Week: 5 days  ? Minutes of Exercise per Session: 30 min  ?Stress: No Stress Concern Present  ? Feeling of Stress : Only a little  ?Social Connections: Unknown  ? Frequency of Communication with Friends and Family: More than three times a week  ? Frequency of Social Gatherings with Friends and Family: Twice a week  ? Attends Religious Services: Patient refused  ? Active Member of Clubs or Organizations: No  ? Attends Archivist Meetings: Not on file  ? Marital Status: Married  ?  ? ?FAMILY HISTORY:  ?We obtained a detailed, 4-generation family history.  Significant diagnoses are listed below: ?Family History  ?Problem Relation Age of Onset  ? Prostate cancer Father 69  ? Bladder Cancer Paternal Uncle   ? Prostate cancer Paternal Uncle   ? Bladder  Cancer Paternal Uncle   ?     dx. late 81s  ? Cancer Paternal Grandmother   ?     possibly had lung cancer, smoked  ? Stomach cancer Other   ? Pancreatic cancer Other   ? Pancreatic cancer Maternal Great-grandfather 69  ? ? ? ? ? ?Mr. Vanacker's maternal great grandfather (grandfather's father) was diagnosed with pancreatic cancer at age 80 and died at age 49. His maternal first cousin once removed (grandfather's brother's son) was diagnosed with pancreatic cancer and died at age 63. His maternal great uncle (grandmother's brother) was diagnosed with stomach cancer and died at age 40. His maternal great grandfather (grandmother's father) was diagnosed with lung  cancer at age 27 and died at 5. ? ?Mr. Hallas's father was diagnosed with prostate cancer at age 49 and no treatment has been recommended at this time, he is currently 12. Mr. Butt father's twin brother was diagnosed with bladder cancer in his late 50s and died due to the bladder cancer at age 5. He has a second paternal uncle who was diagnosed with bladder cancer and prostate cancer, he is currently 74. Mr. Kapaun reports his paternal grandmother may have been diagnosed with lung cancer, she is deceased.  ? ?Mr. Menna reports no known family history of spontaneous pneumothorax. Mr. ANDUJO is unaware of previous family history of genetic testing for hereditary cancer risks.  ? ?GENETIC COUNSELING ASSESSMENT: Mr. FRISTOE is a 36 y.o. male with a personal and family history of cancer which is somewhat suggestive of a hereditary predisposition to cancer given his young age at diagnosis. We, therefore, discussed and recommended the following at today's visit.  ? ?DISCUSSION: We discussed that 5 - 10% of cancer is hereditary and chromophobe renal cell carcinoma may be due to a mutation in the Napoleon gene which is associated with Birt-Hogge-Dube Syndrome. There are other genes that can be associated with hereditary renal call cancer syndromes.  We discussed that testing is beneficial for several reasons including knowing how to follow individuals after completing their treatment and understanding if other family members could be at an increased risk for cancer. ? ?We reviewed the characteristics, features and inheritance patterns of hereditary cancer syndromes. We also discussed genetic testing, including the appropriate family members to test, the process of testing, insurance coverage and turn-around-time for results. We discussed the implications of a negative, positive, carrier and/or variant of uncertain significant result.  ? ?Mr. Pantaleo  was offered a common hereditary cancer panel (47 genes+kidney cancer genes) and  an expanded pan-cancer panel (77 genes). Mr. FAGIN was informed of the benefits and limitations of each panel, including that expanded pan-cancer panels contain genes that do not have clear management guidelines at this point in time.  We also discussed that as the number of genes included on a panel increases, the chances of variants of uncertain significance increases.  After considering the benefits and limitations of each gene panel, Mr. MCBRYAR elected to have [facility] CancerNext-Expanded Panel. ? ?The CancerNext-Expanded gene panel offered by [facility] and includes sequencing, rearrangement, and RNA analysis for the following 77 genes: AIP, ALK, APC, ATM, AXIN2, BAP1, BARD1, BLM, BMPR1A, BRCA1, BRCA2, BRIP1, CDC73, CDH1, CDK4, CDKN1B, CDKN2A, CHEK2, CTNNA1, DICER1, FANCC, FH, FLCN, GALNT12, KIF1B, LZTR1, MAX, MEN1, MET, MLH1, MSH2, MSH3, MSH6, MUTYH, NBN, NF1, NF2, NTHL1, PALB2, PHOX2B, PMS2, POT1, PRKAR1A, PTCH1, PTEN, RAD51C, RAD51D, RB1, RECQL, RET, SDHA, SDHAF2, SDHB, SDHC, SDHD, SMAD4, SMARCA4, SMARCB1, SMARCE1, STK11, SUFU, TMEM127, TP53, TSC1,  TSC2, VHL and XRCC2 (sequencing and deletion/duplication); EGFR, EGLN1, HOXB13, KIT, MITF, PDGFRA, POLD1, and POLE (sequencing only); EPCAM and GREM1 (deletion/duplication only).  ? ?Based on Mr. KETCHUM personal and family history of cancer, he meets medical criteria for genetic testing. Despite that he meets criteria, he may still have an out of pocket cost. We discussed that if his out of pocket cost for testing is over $100, the laboratory will call and confirm whether he wants to proceed with testing.  If the out of pocket cost of testing is less than $100 he will be billed by the genetic testing laboratory.  ? ?PLAN: After considering the risks, benefits, and limitations, Mr. MICHELE provided informed consent to pursue genetic testing and the blood sample was sent to [facility] for analysis of the CancerNext-Expanded Panel. Results should be available  within approximately 2-3 weeks' time, at which point they will be disclosed by telephone to Mr. TRENTMAN, as will any additional recommendations warranted by these results. Mr. FRENKEL will receive a summary

## 2022-03-30 ENCOUNTER — Encounter: Payer: Self-pay | Admitting: Internal Medicine

## 2022-03-30 DIAGNOSIS — Z85528 Personal history of other malignant neoplasm of kidney: Secondary | ICD-10-CM | POA: Insufficient documentation

## 2022-03-30 NOTE — Progress Notes (Signed)
? ? ? ? ?Subjective:  ? ? Patient ID: Richard [name], male    DOB: [birth year], 36 y.o.   MRN: [id number] ? ?This visit occurred during the SARS-CoV-2 public health emergency.  Safety protocols were in place, including screening questions prior to the visit, additional usage of staff PPE, and extensive cleaning of exam room while observing appropriate contact time as indicated for disinfecting solutions.   ? ? ?HPI ?Richard [name] is here for follow up of his chronic medical problems, including h/o renal cell carcinoma ? ?Gross hematuria after running.  Stopped.  A couple of weks later cola colored. Referred to aliance.   ? ? ?Exercising regularly-tennis, bike, running  ? ? ?Medications and allergies reviewed with patient and updated if appropriate. ? ?Current Outpatient Medications on File Prior to Visit  ?Medication Sig Dispense Refill  ? Diclofenac Sodium (PENNSAID) 2 % SOLN Apply topically.    ? fluticasone (FLONASE) 50 MCG/ACT nasal spray Place 1 spray into both nostrils daily. 16 g 0  ? tadalafil (CIALIS) 20 MG tablet TAKE 1 TABLET BY MOUTH DAILY AS NEEDED 30 tablet 5  ? ?No current facility-administered medications on file prior to visit.  ? ? ? ?Review of Systems  ?Constitutional:  Negative for fever.  ?Respiratory:  Negative for cough, shortness of breath and wheezing.   ?Cardiovascular:  Negative for chest pain, palpitations and leg swelling.  ?Neurological:  Positive for headaches (After taking Cialis). Negative for light-headedness.  ? ?   ?Objective:  ? ?Vitals:  ? 03/31/22 1052  ?BP: 130/82  ?Pulse: 73  ?Temp: 98.2 ?F (36.8 ?C)  ?SpO2: 99%  ? ?BP Readings from Last 3 Encounters:  ?03/31/22 130/82  ?02/02/22 (!) 152/90  ?01/12/22 (!) 147/97  ? ?Wt Readings from Last 3 Encounters:  ?03/31/22 226 lb (102.5 kg)  ?01/30/22 224 lb 6.9 oz (101.8 kg)  ?01/12/22 222 lb (100.7 kg)  ? ?Body mass index is 31.52 kg/m?. ? ?  ?Physical Exam ?Constitutional:   ?   General: He is not in acute distress. ?   Appearance: Normal  appearance. He is not ill-appearing.  ?HENT:  ?   Head: Normocephalic and atraumatic.  ?Eyes:  ?   Conjunctiva/sclera: Conjunctivae normal.  ?Cardiovascular:  ?   Rate and Rhythm: Normal rate and regular rhythm.  ?   Heart sounds: Normal heart sounds. No murmur heard. ?Pulmonary:  ?   Effort: Pulmonary effort is normal. No respiratory distress.  ?   Breath sounds: Normal breath sounds. No wheezing or rales.  ?Musculoskeletal:  ?   Right lower leg: No edema.  ?   Left lower leg: No edema.  ?Skin: ?   General: Skin is warm and dry.  ?   Findings: No rash.  ?Neurological:  ?   Mental Status: He is alert. Mental status is at baseline.  ?Psychiatric:     ?   Mood and Affect: Mood normal.  ? ?   ? ?Lab Results  ?Component Value Date  ? WBC 5.3 01/12/2022  ? HGB 13.6 01/31/2022  ? HCT 40.0 01/31/2022  ? PLT 203 01/12/2022  ? GLUCOSE 109 (H) 01/31/2022  ? CHOL 170 05/23/2021  ? TRIG 95.0 05/23/2021  ? HDL 46.70 05/23/2021  ? LDLCALC 104 (H) 05/23/2021  ? ALT 20 11/12/2021  ? AST 16 11/12/2021  ? NA 132 (L) 01/31/2022  ? K 3.4 (L) 01/31/2022  ? CL 98 01/31/2022  ? CREATININE 1.44 (H) 01/31/2022  ? BUN 14 01/31/2022  ?  CO2 27 01/31/2022  ? PSA 0.49 11/12/2021  ? ? ? ?Assessment & Plan:  ? ? ?See Problem List for Assessment and Plan of chronic medical problems.  ? ? ? ?

## 2022-03-30 NOTE — Patient Instructions (Addendum)
? ? ? ? ?  A referral was ordered for Genetics.     Someone from that office will call you to schedule an appointment.  ? ? ?Return for Physical Exam in the summer. ? ?

## 2022-03-31 ENCOUNTER — Ambulatory Visit (INDEPENDENT_AMBULATORY_CARE_PROVIDER_SITE_OTHER): Payer: No Typology Code available for payment source | Admitting: Internal Medicine

## 2022-03-31 ENCOUNTER — Encounter: Payer: Self-pay | Admitting: Internal Medicine

## 2022-03-31 VITALS — BP 130/82 | HR 73 | Temp 98.2°F | Ht 71.0 in | Wt 226.0 lb

## 2022-03-31 DIAGNOSIS — N529 Male erectile dysfunction, unspecified: Secondary | ICD-10-CM

## 2022-03-31 DIAGNOSIS — Z85528 Personal history of other malignant neoplasm of kidney: Secondary | ICD-10-CM

## 2022-03-31 DIAGNOSIS — Z1379 Encounter for other screening for genetic and chromosomal anomalies: Secondary | ICD-10-CM

## 2022-03-31 NOTE — Assessment & Plan Note (Signed)
Following with urology ?Currently taking Cialis 10 mg daily as needed-this does cause headaches-we will try 5 mg since the medication is effective ?If that still causes headaches can do a trial of Viagra 25-50 mg as needed ?

## 2022-03-31 NOTE — Assessment & Plan Note (Signed)
Laparoscopic nephrectomy of the right kidney 01/2022 ?Following with urology every 6 months at this time ?Has had genetic testing-results pending ?

## 2022-04-08 ENCOUNTER — Telehealth: Payer: Self-pay | Admitting: Genetic Counselor

## 2022-04-08 ENCOUNTER — Encounter: Payer: Self-pay | Admitting: Genetic Counselor

## 2022-04-08 DIAGNOSIS — Z1379 Encounter for other screening for genetic and chromosomal anomalies: Secondary | ICD-10-CM | POA: Insufficient documentation

## 2022-04-08 NOTE — Telephone Encounter (Signed)
I attempted to contact Mr. HOTT to discuss his genetic testing results (77 genes). I left a voicemail requesting he call me back at [phone]. ? ?Lucille Passy, MS, LCGC ?Genetic Counselor ?[email or web address] ?(P) [phone] ? ?

## 2022-04-08 NOTE — Telephone Encounter (Signed)
I contacted Mr. Ambrosio to discuss his genetic testing results. No pathogenic variants were identified in the 77 genes analyzed. Detailed clinic note to follow. ? ?The test report has been scanned into EPIC and is located under the Molecular Pathology section of the Results Review tab.  A portion of the result report is included below for reference.  ? ?Lucille Passy, MS, [place] ?Genetic Counselor ?[email or web address] ?(P) [phone] ? ? ?

## 2022-04-09 ENCOUNTER — Encounter: Payer: Self-pay | Admitting: Dermatology

## 2022-04-09 ENCOUNTER — Ambulatory Visit (INDEPENDENT_AMBULATORY_CARE_PROVIDER_SITE_OTHER): Payer: No Typology Code available for payment source | Admitting: Dermatology

## 2022-04-09 DIAGNOSIS — Z1283 Encounter for screening for malignant neoplasm of skin: Secondary | ICD-10-CM | POA: Diagnosis not present

## 2022-04-10 ENCOUNTER — Ambulatory Visit: Payer: Self-pay | Admitting: Genetic Counselor

## 2022-04-10 DIAGNOSIS — Z1379 Encounter for other screening for genetic and chromosomal anomalies: Secondary | ICD-10-CM

## 2022-04-10 NOTE — Progress Notes (Signed)
HPI:   ?Mr. Pienta was previously seen in the [facility] clinic due to a personal and family history of cancer and concerns regarding a hereditary predisposition to cancer. Please refer to our prior cancer genetics clinic note for more information regarding our discussion, assessment and recommendations, at the time. Mr. RITTENHOUSE recent genetic test results were disclosed to him, as were recommendations warranted by these results. These results and recommendations are discussed in more detail below. ? ?CANCER HISTORY:  ?In February 2023, at the age of 36, Mr. Lanzer was diagnosed with chromophobe renal cell carcinoma. ? ?FAMILY HISTORY:  ?We obtained a detailed, 4-generation family history.  Significant diagnoses are listed below: ?     ?Family History  ?Problem Relation Age of Onset  ? Prostate cancer Father 10  ? Bladder Cancer Paternal Uncle    ? Prostate cancer Paternal Uncle    ? Bladder Cancer Paternal Uncle    ?      dx. late 35s  ? Cancer Paternal Grandmother    ?      possibly had lung cancer, smoked  ? Stomach cancer Other    ? Pancreatic cancer Other    ? Pancreatic cancer Maternal Great-grandfather 32  ?  ?  ?  ?  ?Mr. Eddleman's maternal great grandfather (grandfather's father) was diagnosed with pancreatic cancer at age 108 and died at age 69. His maternal first cousin once removed (grandfather's brother's son) was diagnosed with pancreatic cancer and died at age 81. His maternal great uncle (grandmother's brother) was diagnosed with stomach cancer and died at age 71. His maternal great grandfather (grandmother's father) was diagnosed with lung cancer at age 42 and died at 11. ?  ?Mr. Kohlbeck's father was diagnosed with prostate cancer at age 25 and no treatment has been recommended at this time, he is currently 17. Mr. Lingard father's twin brother was diagnosed with bladder cancer in his late 63s and died due to the bladder cancer at age 11. He has a second paternal uncle who was diagnosed  with bladder cancer and prostate cancer, he is currently 42. Mr. Galli reports his paternal grandmother may have been diagnosed with lung cancer, she is deceased.  ?  ?Mr. Appleyard reports no known family history of spontaneous pneumothorax. Mr. DUNNAWAY is unaware of previous family history of genetic testing for hereditary cancer risks.  ? ?GENETIC TEST RESULTS:  ?The [facility] CancerNext-Expanded Panel found no pathogenic mutations. ? ?The CancerNext-Expanded gene panel offered by [facility] and includes sequencing, rearrangement, and RNA analysis for the following 77 genes: AIP, ALK, APC, ATM, AXIN2, BAP1, BARD1, BLM, BMPR1A, BRCA1, BRCA2, BRIP1, CDC73, CDH1, CDK4, CDKN1B, CDKN2A, CHEK2, CTNNA1, DICER1, FANCC, FH, FLCN, GALNT12, KIF1B, LZTR1, MAX, MEN1, MET, MLH1, MSH2, MSH3, MSH6, MUTYH, NBN, NF1, NF2, NTHL1, PALB2, PHOX2B, PMS2, POT1, PRKAR1A, PTCH1, PTEN, RAD51C, RAD51D, RB1, RECQL, RET, SDHA, SDHAF2, SDHB, SDHC, SDHD, SMAD4, SMARCA4, SMARCB1, SMARCE1, STK11, SUFU, TMEM127, TP53, TSC1, TSC2, VHL and XRCC2 (sequencing and deletion/duplication); EGFR, EGLN1, HOXB13, KIT, MITF, PDGFRA, POLD1, and POLE (sequencing only); EPCAM and GREM1 (deletion/duplication only).   ? ?The test report has been scanned into EPIC and is located under the Molecular Pathology section of the Results Review tab.  A portion of the result report is included below for reference. Genetic testing reported out on 04/06/2022. ? ?  ? ? ? ? ? ?Even though a pathogenic variant was not identified, possible explanations for his personal history of cancer may include: ?There may be  no hereditary risk for cancer in the family. The cancers in Mr. CZAJA and/or his family may be due to other genetic or environmental factors. ?There may be a gene mutation in one of these genes that current testing methods cannot detect, but that chance is small. ?There could be another gene that has not yet been discovered, or that we have not yet tested, that is responsible  for the cancer diagnoses in the family.  ? ?Therefore, it is important to remain in touch with cancer genetics in the future so that we can continue to offer Mr. NARINE the most up to date genetic testing.  ? ?ADDITIONAL GENETIC TESTING:  ?We discussed with Mr. CURRIER that his genetic testing was fairly extensive.  If there are genes identified to increase cancer risk that can be analyzed in the future, we would be happy to discuss and coordinate this testing at that time.   ? ?CANCER SCREENING RECOMMENDATIONS:  ?Mr. Cartlidge's test result is considered negative (normal).  This means that we have not identified a hereditary cause for his personal and family history of cancer at this time. Most cancers happen by chance and this negative test suggests that his cancer may fall into this category.   ? ?An individual's cancer risk and medical management are not determined by genetic test results alone. Overall cancer risk assessment incorporates additional factors, including personal medical history, family history, and any available genetic information that may result in a personalized plan for cancer prevention and surveillance. Therefore, it is recommended he continue to follow the cancer management and screening guidelines provided by his oncology and primary healthcare provider. ? ?FOLLOW-UP:  ?Cancer genetics is a rapidly advancing field and it is possible that new genetic tests will be appropriate for him and/or his family members in the future. We encouraged him to remain in contact with cancer genetics on an annual basis so we can update his personal and family histories and let him know of advances in cancer genetics that may benefit this family.  ? ?Our contact number was provided. Mr. NGUYEN questions were answered to his satisfaction, and he knows he is welcome to call us at anytime with additional questions or concerns.  ? ?Lucille Passy, MS, [place] ?Genetic Counselor ?[email or web address] ?(P)  [phone] ? ? ?

## 2022-04-15 ENCOUNTER — Encounter: Payer: Self-pay | Admitting: Family Medicine

## 2022-04-17 ENCOUNTER — Encounter: Payer: Self-pay | Admitting: Genetic Counselor

## 2022-04-26 ENCOUNTER — Encounter: Payer: Self-pay | Admitting: Dermatology

## 2022-04-26 NOTE — Progress Notes (Signed)
   Follow-Up Visit   Subjective  Richard [name] is a 36 y.o. male who presents for the following: Annual Exam (No new concerns).  Annual skin examination Location:  Duration:  Quality:  Associated Signs/Symptoms: Modifying Factors:  Severity:  Timing: Context:   Objective  Well appearing patient in no apparent distress; mood and affect are within normal limits. General skin examination no atypical nevi (all pigmented lesions checked with dermoscopy) or signs of NMSC noted at the time of the visit.     A full examination was performed including scalp, head, eyes, ears, nose, lips, neck, chest, axillae, abdomen, back, buttocks, bilateral upper extremities, bilateral lower extremities, hands, feet, fingers, toes, fingernails, and toenails. All findings within normal limits unless otherwise noted below.   Assessment & Plan    Skin exam for malignant neoplasm  Encouraged to self examine with his spouse twice annually, continued ultraviolet protection.  Optional annual examination by dermatologist.      I, Lavonna Monarch, MD, have reviewed all documentation for this visit.  The documentation on 04/26/22 for the exam, diagnosis, procedures, and orders are all accurate and complete.

## 2022-05-20 NOTE — Progress Notes (Signed)
[facility] [place] Phone: [phone] Subjective:   Richard [name], am serving as a scribe for Dr. Hulan Saas.   I'm seeing this patient by the request  of:  Binnie Rail, MD  CC: Right knee pain  EHM:[id number]  Richard [name] is a 36 y.o. male coming in with complaint of R knee pain. Last seen in December 2022 for knee pain. Patient states that his pain is over proximal lateral gastroc. Pain with walking. Pain began about 5 days ago. Using compression prn.       Past Medical History:  Diagnosis Date   Stress fracture, right ankle, sequela    Subluxation of extensor carpi ulnaris tendon, right, sequela    Past Surgical History:  Procedure Laterality Date   ROBOT ASSISTED LAPAROSCOPIC NEPHRECTOMY Right 01/30/2022   Procedure: XI ROBOTIC ASSISTED LAPAROSCOPIC NEPHRECTOMY;  Surgeon: Alexis Frock, MD;  Location: [facility];  Service: Urology;  Laterality: Right;   TONSILLECTOMY AND ADENOIDECTOMY  1996   WISDOM TOOTH EXTRACTION     Social History   Socioeconomic History   Marital status: Married    Spouse name: Not on file   Number of children: Not on file   Years of education: Not on file   Highest education level: Master's degree (e.g., MA, MS, MEng, MEd, MSW, MBA)  Occupational History   Not on file  Tobacco Use   Smoking status: Never   Smokeless tobacco: Never  Vaping Use   Vaping Use: Never used  Substance and Sexual Activity   Alcohol use: Yes    Comment: social on weekend   Drug use: Never   Sexual activity: Yes    Partners: Female  Other Topics Concern   Not on file  Social History Narrative   Not on file   Social Determinants of Health   Financial Resource Strain: Low Risk  (11/12/2021)   Overall Financial Resource Strain (CARDIA)    Difficulty of Paying Living Expenses: Not hard at all  Food Insecurity: [name] Food Insecurity (11/12/2021)   Hunger Vital Sign    Worried About Running  Out of Food in the Last Year: Never true    [place] in the Last Year: Never true  Transportation Needs: [name] Transportation Needs (11/12/2021)   PRAPARE - Hydrologist (Medical): [name]    Lack of Transportation (Non-Medical): [name]  Physical Activity: Sufficiently Active (11/12/2021)   Exercise Vital Sign    Days of Exercise per Week: 5 days    Minutes of Exercise per Session: 30 min  Stress: [name] Stress Concern Present (11/12/2021)   [place]    Feeling of Stress : Only a little  Social Connections: Unknown (11/12/2021)   Social Connection and Isolation Panel [NHANES]    Frequency of Communication with Friends and Family: More than three times a week    Frequency of Social Gatherings with Friends and Family: Twice a week    Attends Religious Services: Patient refused    Marine scientist or Organizations: [name]    Attends Music therapist: Not on file    Marital Status: Married   Allergies  Allergen Reactions   Prednisone Palpitations    Racing heart   Family History  Problem Relation Age of Onset   Hyperlipidemia Mother    Hypertension Mother    High blood pressure Mother  High Cholesterol Mother    Hypertension Father    Arthritis-Osteo Father    Hearing loss Father    High blood pressure Father    High Cholesterol Father    Glaucoma Father    Prostate cancer Father 53   Asthma Sister    Glaucoma Sister    Polycystic ovary syndrome Sister    Heart Problems Maternal Uncle        ?left ventricular issues, ICD   High Cholesterol Maternal Uncle    Bladder Cancer Paternal Uncle    Prostate cancer Paternal Uncle    Bladder Cancer Paternal Uncle        dx. late 42s   Hearing loss Maternal Grandmother    Heart disease Maternal Grandmother 80   High blood pressure Maternal Grandmother    High Cholesterol Maternal Grandmother    Stroke Maternal Grandmother 80        TIAs   Heart attack Maternal Grandfather 54   High Cholesterol Maternal Grandfather    Cancer Paternal Grandmother        possibly had lung cancer, smoked   Hearing loss Paternal Grandfather    Pancreatic cancer Maternal Great-grandfather 74   Stomach cancer Other    Pancreatic cancer Other      Current Outpatient Medications (Cardiovascular):    tadalafil (CIALIS) 20 MG tablet, TAKE 1 TABLET BY MOUTH DAILY AS NEEDED  Current Outpatient Medications (Respiratory):    fluticasone (FLONASE) 50 MCG/ACT nasal spray, Place 1 spray into both nostrils daily.    Current Outpatient Medications (Other):    Diclofenac Sodium 2 % SOLN, Apply topically.   Reviewed prior external information including notes and imaging from  primary care provider As well as notes that were available from care everywhere and other healthcare systems.  Past medical history, social, surgical and family history all reviewed in electronic medical record.  [name] pertanent information unless stated regarding to the chief complaint.   Review of Systems:  [name] headache, visual changes, nausea, vomiting, diarrhea, constipation, dizziness, abdominal pain, skin rash, fevers, chills, night sweats, weight loss, swollen lymph nodes, body aches, joint swelling, chest pain, shortness of breath, mood changes. POSITIVE muscle aches  Objective  Blood pressure 132/88, pulse 70, height '5\' 11"'$  (1.803 m), SpO2 97 %.   General: [name] apparent distress alert and oriented x3 mood and affect normal, dressed appropriately.  HEENT: Pupils equal, extraocular movements intact  Respiratory: Patient's speak in full sentences and does not appear short of breath  Cardiovascular: [name] lower extremity edema, non tender, [name] erythema  Right knee exam does have some trace effusion noted.  Lacks last 10 degrees of flexion.  Patient does have full range of motion.  Tender to palpation over the posterior aspect of the knee more laterally.  Negative Thompson  test.  Limited muscular skeletal ultrasound was performed and interpreted by Hulan Saas, M  Limited ultrasound shows the patient does have some very mild hypoechoic changes around the popliteal tendon. Mild increase in neovascularization in the area.  Patient has [name] significant effusion of the patellofemoral joint.  Meniscus appear to be unremarkable laterally or medially.  [name] sign of any type of Baker's cyst.    Impression and Recommendations:

## 2022-05-22 ENCOUNTER — Encounter: Payer: Self-pay | Admitting: Family Medicine

## 2022-05-22 ENCOUNTER — Ambulatory Visit: Payer: No Typology Code available for payment source | Admitting: Family Medicine

## 2022-05-22 ENCOUNTER — Ambulatory Visit: Payer: Self-pay

## 2022-05-22 VITALS — BP 132/88 | HR 70 | Ht 71.0 in

## 2022-05-22 DIAGNOSIS — M76891 Other specified enthesopathies of right lower limb, excluding foot: Secondary | ICD-10-CM | POA: Diagnosis not present

## 2022-05-22 DIAGNOSIS — M25562 Pain in left knee: Secondary | ICD-10-CM | POA: Diagnosis not present

## 2022-05-22 NOTE — Patient Instructions (Signed)
Good to see you! Do prescribed exercises at least 3x a week See you again in 7-8 weeks

## 2022-05-22 NOTE — Assessment & Plan Note (Signed)
Patient I do think has more of a popliteal tendinitis.  We discussed with patient in great length about topical anti-inflammatories, icing regimen, which activities to do and which ones to avoid, increase activity slowly.  Discussed that some of this could be hamstring weakness.  We will get x-rays secondary to patient having the history of the renal cell carcinoma.  Follow-up with me again in 6 to 8 weeks otherwise.

## 2022-05-24 NOTE — Progress Notes (Unsigned)
Subjective:    Patient ID: Richard [name], male    DOB: 01/11/1986, 36 y.o.   MRN: [id number]     HPI Richard [name] is here for a physical exam.  His blood pressure slightly elevated here today.  That is 1 concern.  He otherwise feels like he is doing fairly well.    Medications and allergies reviewed with patient and updated if appropriate.  Current Outpatient Medications on File Prior to Visit  Medication Sig Dispense Refill   Diclofenac Sodium 2 % SOLN Apply topically.     fluticasone (FLONASE) 50 MCG/ACT nasal spray Place 1 spray into both nostrils daily. 16 g 0   tadalafil (CIALIS) 20 MG tablet TAKE 1 TABLET BY MOUTH DAILY AS NEEDED 30 tablet 5   No current facility-administered medications on file prior to visit.    Review of Systems  Constitutional:  Negative for chills and fever.  Eyes:  Negative for visual disturbance.  Respiratory:  Negative for cough, shortness of breath and wheezing.   Cardiovascular:  Negative for chest pain, palpitations and leg swelling.  Gastrointestinal:  Negative for abdominal pain, blood in stool, constipation, diarrhea and nausea.       Gerd occ  Genitourinary:  Negative for dysuria and hematuria.  Musculoskeletal:  Positive for arthralgias. Negative for back pain.  Skin:  Negative for rash.  Neurological:  Positive for headaches (with cialis). Negative for light-headedness.  Psychiatric/Behavioral:  Negative for dysphoric mood. The patient is not nervous/anxious.        Objective:   Vitals:   05/26/22 0852  BP: (!) 142/90  Pulse: 68  Temp: 98.3 F (36.8 C)  SpO2: 97%   Filed Weights   05/26/22 0852  Weight: 227 lb (103 kg)   Body mass index is 31.66 kg/m.  BP Readings from Last 3 Encounters:  05/26/22 (!) 142/90  05/22/22 132/88  03/31/22 130/82    Wt Readings from Last 3 Encounters:  05/26/22 227 lb (103 kg)  03/31/22 226 lb (102.5 kg)  01/30/22 224 lb 6.9 oz (101.8 kg)      Physical Exam Constitutional: He  appears well-developed and well-nourished. No distress.  HENT:  Head: Normocephalic and atraumatic.  Right Ear: External ear normal.  Left Ear: External ear normal.  Mouth/Throat: Oropharynx is clear and moist.  Normal ear canals and TM b/l  Eyes: Conjunctivae and EOM are normal.  Neck: Neck supple. No tracheal deviation present. No thyromegaly present.  No carotid bruit  Cardiovascular: Normal rate, regular rhythm, normal heart sounds and intact distal pulses.   No murmur heard. Pulmonary/Chest: Effort normal and breath sounds normal. No respiratory distress. He has no wheezes. He has no rales.  Abdominal: Soft. He exhibits no distension. There is no tenderness.  Genitourinary: deferred  Musculoskeletal: He exhibits no edema.  Lymphadenopathy:   He has no cervical adenopathy.  Skin: Skin is warm and dry. He is not diaphoretic.  Psychiatric: He has a normal mood and affect. His behavior is normal.         Assessment & Plan:   Physical exam: Screening blood work  ordered Exercise   regular - tennis once a week, irregular with running -knows he needs to be exercising more regularly Weight  advised weight loss Diet - eats low sodium diet, working on decreasing snacking Substance abuse   none   Reviewed recommended immunizations.   Health Maintenance  Topic Date Due   INFLUENZA VACCINE  07/07/2022   TETANUS/TDAP  05/20/2030  COVID-19 Vaccine  Completed   Hepatitis C Screening  Completed   HIV Screening  Completed   HPV VACCINES  Aged Out     See Problem List for Assessment and Plan of chronic medical problems.

## 2022-05-24 NOTE — Patient Instructions (Addendum)
BP ideally less than 130/80   Blood work was ordered.     Medications changes include :   amlodipine 5 mg daily for BP     Return in about 6 months (around 11/25/2022) for hypertension.   Health Maintenance, Male Adopting a healthy lifestyle and getting preventive care are important in promoting health and wellness. Ask your health care provider about: The right schedule for you to have regular tests and exams. Things you can do on your own to prevent diseases and keep yourself healthy. What should I know about diet, weight, and exercise? Eat a healthy diet  Eat a diet that includes plenty of vegetables, fruits, low-fat dairy products, and lean protein. Do not eat a lot of foods that are high in solid fats, added sugars, or sodium. Maintain a healthy weight Body mass index (BMI) is a measurement that can be used to identify possible weight problems. It estimates body fat based on height and weight. Your health care provider can help determine your BMI and help you achieve or maintain a healthy weight. Get regular exercise Get regular exercise. This is one of the most important things you can do for your health. Most adults should: Exercise for at least 150 minutes each week. The exercise should increase your heart rate and make you sweat (moderate-intensity exercise). Do strengthening exercises at least twice a week. This is in addition to the moderate-intensity exercise. Spend less time sitting. Even light physical activity can be beneficial. Watch cholesterol and blood lipids Have your blood tested for lipids and cholesterol at 37 years of age, then have this test every 5 years. You may need to have your cholesterol levels checked more often if: Your lipid or cholesterol levels are high. You are older than 37 years of age. You are at high risk for heart disease. What should I know about cancer screening? Many types of cancers can be detected early and may often be  prevented. Depending on your health history and family history, you may need to have cancer screening at various ages. This may include screening for: Colorectal cancer. Prostate cancer. Skin cancer. Lung cancer. What should I know about heart disease, diabetes, and high blood pressure? Blood pressure and heart disease High blood pressure causes heart disease and increases the risk of stroke. This is more likely to develop in people who have high blood pressure readings or are overweight. Talk with your health care provider about your target blood pressure readings. Have your blood pressure checked: Every 3-5 years if you are 56-58 years of age. Every year if you are 6 years old or older. If you are between the ages of 28 and 63 and are a current or former smoker, ask your health care provider if you should have a one-time screening for abdominal aortic aneurysm (AAA). Diabetes Have regular diabetes screenings. This checks your fasting blood sugar level. Have the screening done: Once every three years after age 78 if you are at a normal weight and have a low risk for diabetes. More often and at a younger age if you are overweight or have a high risk for diabetes. What should I know about preventing infection? Hepatitis B If you have a higher risk for hepatitis B, you should be screened for this virus. Talk with your health care provider to find out if you are at risk for hepatitis B infection. Hepatitis C Blood testing is recommended for: Everyone born from [birth year] through 1965. Anyone with known  risk factors for hepatitis C. Sexually transmitted infections (STIs) You should be screened each year for STIs, including gonorrhea and chlamydia, if: You are sexually active and are younger than 37 years of age. You are older than 37 years of age and your health care provider tells you that you are at risk for this type of infection. Your sexual activity has changed since you were last screened,  and you are at increased risk for chlamydia or gonorrhea. Ask your health care provider if you are at risk. Ask your health care provider about whether you are at high risk for HIV. Your health care provider may recommend a prescription medicine to help prevent HIV infection. If you choose to take medicine to prevent HIV, you should first get tested for HIV. You should then be tested every 3 months for as long as you are taking the medicine. Follow these instructions at home: Alcohol use Do not drink alcohol if your health care provider tells you not to drink. If you drink alcohol: Limit how much you have to 0-2 drinks a day. Know how much alcohol is in your drink. In the [place] one drink equals one 12 oz bottle of beer (355 mL), one 5 oz glass of wine (148 mL), or one 1 oz glass of hard liquor (44 mL). Lifestyle Do not use any products that contain nicotine or tobacco. These products include cigarettes, chewing tobacco, and vaping devices, such as e-cigarettes. If you need help quitting, ask your health care provider. Do not use street drugs. Do not share needles. Ask your health care provider for help if you need support or information about quitting drugs. General instructions Schedule regular health, dental, and eye exams. Stay current with your vaccines. Tell your health care provider if: You often feel depressed. You have ever been abused or do not feel safe at home. Summary Adopting a healthy lifestyle and getting preventive care are important in promoting health and wellness. Follow your health care provider's instructions about healthy diet, exercising, and getting tested or screened for diseases. Follow your health care provider's instructions on monitoring your cholesterol and blood pressure. This information is not intended to replace advice given to you by your health care provider. Make sure you discuss any questions you have with your health care provider. Document Revised:  04/14/2021 Document Reviewed: 04/14/2021 Elsevier Patient Education  [place].

## 2022-05-25 ENCOUNTER — Encounter: Payer: Self-pay | Admitting: Internal Medicine

## 2022-05-25 ENCOUNTER — Encounter: Payer: 59 | Admitting: Family Medicine

## 2022-05-26 ENCOUNTER — Other Ambulatory Visit (HOSPITAL_BASED_OUTPATIENT_CLINIC_OR_DEPARTMENT_OTHER): Payer: Self-pay

## 2022-05-26 ENCOUNTER — Ambulatory Visit (INDEPENDENT_AMBULATORY_CARE_PROVIDER_SITE_OTHER): Payer: No Typology Code available for payment source | Admitting: Internal Medicine

## 2022-05-26 VITALS — BP 142/90 | HR 68 | Temp 98.3°F | Ht 71.0 in | Wt 227.0 lb

## 2022-05-26 DIAGNOSIS — Z0000 Encounter for general adult medical examination without abnormal findings: Secondary | ICD-10-CM | POA: Diagnosis not present

## 2022-05-26 DIAGNOSIS — I10 Essential (primary) hypertension: Secondary | ICD-10-CM | POA: Diagnosis not present

## 2022-05-26 DIAGNOSIS — Z136 Encounter for screening for cardiovascular disorders: Secondary | ICD-10-CM | POA: Diagnosis not present

## 2022-05-26 DIAGNOSIS — N529 Male erectile dysfunction, unspecified: Secondary | ICD-10-CM

## 2022-05-26 DIAGNOSIS — R739 Hyperglycemia, unspecified: Secondary | ICD-10-CM

## 2022-05-26 LAB — LIPID PANEL
Cholesterol: 203 mg/dL — ABNORMAL HIGH (ref 0–200)
HDL: 48.9 mg/dL (ref >39.00)
LDL Cholesterol: 132 mg/dL — ABNORMAL HIGH (ref 0–99)
NonHDL: 154.02
Total CHOL/HDL Ratio: 4
Triglycerides: 111 mg/dL (ref 0.0–149.0)
VLDL: 22.2 mg/dL (ref 0.0–40.0)

## 2022-05-26 LAB — COMPREHENSIVE METABOLIC PANEL
ALT: 17 U/L (ref 0–53)
AST: 17 U/L (ref 0–37)
Albumin: 4.6 g/dL (ref 3.5–5.2)
Alkaline Phosphatase: 63 U/L (ref 39–117)
BUN: 21 mg/dL (ref 6–23)
CO2: 31 mEq/L (ref 19–32)
Calcium: 9.5 mg/dL (ref 8.4–10.5)
Chloride: 99 mEq/L (ref 96–112)
Creatinine, Ser: 1.44 mg/dL (ref 0.40–1.50)
GFR: 62.17 mL/min (ref >60.00)
Glucose, Bld: 93 mg/dL (ref 70–99)
Potassium: 4.4 mEq/L (ref 3.5–5.1)
Sodium: 136 mEq/L (ref 135–145)
Total Bilirubin: 1 mg/dL (ref 0.2–1.2)
Total Protein: 7.3 g/dL (ref 6.0–8.3)

## 2022-05-26 LAB — HEMOGLOBIN A1C: Hgb A1c MFr Bld: 5.2 % (ref 4.6–6.5)

## 2022-05-26 LAB — CBC WITH DIFFERENTIAL/PLATELET
Basophils Absolute: 0 10*3/uL (ref 0.0–0.1)
Basophils Relative: 1 % (ref 0.0–3.0)
Eosinophils Absolute: 0.1 10*3/uL (ref 0.0–0.7)
Eosinophils Relative: 2.1 % (ref 0.0–5.0)
HCT: 42.3 % (ref 39.0–52.0)
Hemoglobin: 14.3 g/dL (ref 13.0–17.0)
Lymphocytes Relative: 31.5 % (ref 12.0–46.0)
Lymphs Abs: 1.2 10*3/uL (ref 0.7–4.0)
MCHC: 33.9 g/dL (ref 30.0–36.0)
MCV: 88.6 fl (ref 78.0–100.0)
Monocytes Absolute: 0.3 10*3/uL (ref 0.1–1.0)
Monocytes Relative: 8.7 % (ref 3.0–12.0)
Neutro Abs: 2.2 10*3/uL (ref 1.4–7.7)
Neutrophils Relative %: 56.7 % (ref 43.0–77.0)
Platelets: 203 10*3/uL (ref 150.0–400.0)
RBC: 4.77 Mil/uL (ref 4.22–5.81)
RDW: 13.4 % (ref 11.5–15.5)
WBC: 4 10*3/uL (ref 4.0–10.5)

## 2022-05-26 LAB — TSH: TSH: 1.79 u[IU]/mL (ref 0.35–5.50)

## 2022-05-26 MED ORDER — AMLODIPINE BESYLATE 5 MG PO TABS
5.0000 mg | ORAL_TABLET | Freq: Every day | ORAL | 3 refills | Status: DC
  Filled 2022-05-26: qty 90, 90d supply, fill #0
  Filled 2022-08-19: qty 90, 90d supply, fill #1
  Filled 2022-11-17: qty 90, 90d supply, fill #2
  Filled 2023-02-24: qty 87, 87d supply, fill #0

## 2022-05-26 NOTE — Assessment & Plan Note (Signed)
Has had updated glucose in a couple of occasions We will check A1c

## 2022-05-26 NOTE — Assessment & Plan Note (Signed)
Chronic Intermittently using Cialis, which does help-he does get some headaches from it Continue Cialis 10 mg daily as needed

## 2022-05-26 NOTE — Assessment & Plan Note (Addendum)
New BP not controlled and only has one kidney so will need to start medication Continue low sodium diet Start amlodipine 5 mg daily He will start monitoring his BP at home-discussed goal  cmp

## 2022-06-17 ENCOUNTER — Ambulatory Visit: Payer: No Typology Code available for payment source | Admitting: Family Medicine

## 2022-06-18 ENCOUNTER — Encounter: Payer: Self-pay | Admitting: Internal Medicine

## 2022-07-17 ENCOUNTER — Ambulatory Visit: Payer: No Typology Code available for payment source | Admitting: Family Medicine

## 2022-07-28 ENCOUNTER — Encounter: Payer: Self-pay | Admitting: Internal Medicine

## 2022-07-30 ENCOUNTER — Other Ambulatory Visit (HOSPITAL_BASED_OUTPATIENT_CLINIC_OR_DEPARTMENT_OTHER): Payer: Self-pay

## 2022-08-19 ENCOUNTER — Other Ambulatory Visit (HOSPITAL_BASED_OUTPATIENT_CLINIC_OR_DEPARTMENT_OTHER): Payer: Self-pay

## 2022-09-17 ENCOUNTER — Other Ambulatory Visit (HOSPITAL_BASED_OUTPATIENT_CLINIC_OR_DEPARTMENT_OTHER): Payer: Self-pay

## 2022-09-17 MED ORDER — INFLUENZA VAC SPLIT QUAD 0.5 ML IM SUSY
PREFILLED_SYRINGE | INTRAMUSCULAR | 0 refills | Status: DC
  Filled 2022-09-17: qty 0.5, 1d supply, fill #0

## 2022-09-22 NOTE — Progress Notes (Signed)
[facility] [address] [place] Phone: [phone] Subjective:   IVilma Meckel, am serving as a scribe for Dr. Hulan Saas.  I'm seeing this patient by the request  of:  Binnie Rail, MD  CC: left knee pain   VCB:[id number]  06/01/2022 Patient I do think has more of a popliteal tendinitis.  We discussed with patient in great length about topical anti-inflammatories, icing regimen, which activities to do and which ones to avoid, increase activity slowly.  Discussed that some of this could be hamstring weakness.  We will get x-rays secondary to patient having the history of the renal cell carcinoma.  Follow-up with me again in 6 to 8 weeks otherwise.  Update 09/23/2022 Gilford Rile FRICKER is a 36 y.o. male coming in with complaint of L knee pain. Patient states went running for the firs time last week with brace and felt sore afterwards, but okay. A few days later went running without brace and left knee felt really inflamed and buckled a few times. Right knee is becoming similar to left, but left still worse. Left shoulder chest area feeling tight, suggested scap exercises to get in good postural position.       Past Medical History:  Diagnosis Date   Stress fracture, right ankle, sequela    Subluxation of extensor carpi ulnaris tendon, right, sequela    Past Surgical History:  Procedure Laterality Date   ROBOT ASSISTED LAPAROSCOPIC NEPHRECTOMY Right 01/30/2022   Procedure: XI ROBOTIC ASSISTED LAPAROSCOPIC NEPHRECTOMY;  Surgeon: Alexis Frock, MD;  Location: [facility];  Service: Urology;  Laterality: Right;   TONSILLECTOMY AND ADENOIDECTOMY  1996   WISDOM TOOTH EXTRACTION     Social History   Socioeconomic History   Marital status: Married    Spouse name: Not on file   Number of children: Not on file   Years of education: Not on file   Highest education level: Master's degree (e.g., MA, MS, MEng, MEd, MSW, MBA)  Occupational  History   Not on file  Tobacco Use   Smoking status: Never   Smokeless tobacco: Never  Vaping Use   Vaping Use: Never used  Substance and Sexual Activity   Alcohol use: Yes    Comment: social on weekend   Drug use: Never   Sexual activity: Yes    Partners: Female  Other Topics Concern   Not on file  Social History Narrative   Not on file   Social Determinants of Health   Financial Resource Strain: Low Risk  (11/12/2021)   Overall Financial Resource Strain (CARDIA)    Difficulty of Paying Living Expenses: Not hard at all  Food Insecurity: No Food Insecurity (11/12/2021)   Hunger Vital Sign    Worried About Running Out of Food in the Last Year: Never true    [place] in the Last Year: Never true  Transportation Needs: No Transportation Needs (11/12/2021)   PRAPARE - Hydrologist (Medical): No    Lack of Transportation (Non-Medical): No  Physical Activity: Sufficiently Active (11/12/2021)   Exercise Vital Sign    Days of Exercise per Week: 5 days    Minutes of Exercise per Session: 30 min  Stress: No Stress Concern Present (11/12/2021)   [place]    Feeling of Stress : Only a little  Social Connections: Unknown (11/12/2021)   Social Connection and Isolation Panel [NHANES]  Frequency of Communication with Friends and Family: More than three times a week    Frequency of Social Gatherings with Friends and Family: Twice a week    Attends Religious Services: Patient refused    Marine scientist or Organizations: No    Attends Music therapist: Not on file    Marital Status: Married   Allergies  Allergen Reactions   Prednisone Palpitations    Racing heart   Family History  Problem Relation Age of Onset   Hyperlipidemia Mother    Hypertension Mother    High blood pressure Mother    High Cholesterol Mother    Hypertension Father    Arthritis-Osteo Father     Hearing loss Father    High blood pressure Father    High Cholesterol Father    Glaucoma Father    Prostate cancer Father 67   Asthma Sister    Glaucoma Sister    Polycystic ovary syndrome Sister    Heart Problems Maternal Uncle        ?left ventricular issues, ICD   High Cholesterol Maternal Uncle    Bladder Cancer Paternal Uncle    Prostate cancer Paternal Uncle    Bladder Cancer Paternal Uncle        dx. late 10s   Hearing loss Maternal Grandmother    Heart disease Maternal Grandmother 80   High blood pressure Maternal Grandmother    High Cholesterol Maternal Grandmother    Stroke Maternal Grandmother 80       TIAs   Heart attack Maternal Grandfather 54   High Cholesterol Maternal Grandfather    Cancer Paternal Grandmother        possibly had lung cancer, smoked   Hearing loss Paternal Grandfather    Pancreatic cancer Maternal Great-grandfather 74   Stomach cancer Other    Pancreatic cancer Other      Current Outpatient Medications (Cardiovascular):    amLODipine (NORVASC) 5 MG tablet, Take 1 tablet (5 mg total) by mouth daily.   tadalafil (CIALIS) 20 MG tablet, TAKE 1 TABLET BY MOUTH DAILY AS NEEDED  Current Outpatient Medications (Respiratory):    fluticasone (FLONASE) 50 MCG/ACT nasal spray, Place 1 spray into both nostrils daily.    Current Outpatient Medications (Other):    Diclofenac Sodium 2 % SOLN, Apply topically.   influenza vac split quadrivalent PF (FLUARIX) 0.5 ML injection, Inject into the muscle.   Reviewed prior external information including notes and imaging from  primary care provider As well as notes that were available from care everywhere and other healthcare systems.  Past medical history, social, surgical and family history all reviewed in electronic medical record.  No pertanent information unless stated regarding to the chief complaint.   Review of Systems:  No headache, visual changes, nausea, vomiting, diarrhea, constipation,  dizziness, abdominal pain, skin rash, fevers, chills, night sweats, weight loss, swollen lymph nodes, body aches, joint swelling, chest pain, shortness of breath, mood changes. POSITIVE muscle aches  Objective  Blood pressure 122/84, pulse 77, height '5\' 11"'$  (1.803 m), weight 228 lb (103.4 kg), SpO2 95 %.   General: No apparent distress alert and oriented x3 mood and affect normal, dressed appropriately.  HEENT: Pupils equal, extraocular movements intact  Respiratory: Patient's speak in full sentences and does not appear short of breath  Cardiovascular: No lower extremity edema, non tender, no erythema  Left knee exam shows tenderness to palpation of the medial aspect of the knee.  Noted.  No swelling  on the posterior aspect of the knee.  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  Patient's posterior meniscus appears to be nearly degenerative at this moment.  Patient does have a trace effusion noted of the patellofemoral joint.  No Baker's cyst noted today. Impression: Meniscal tear    Impression and Recommendations:     The above documentation has been reviewed and is accurate and complete Lyndal Pulley, DO

## 2022-09-23 ENCOUNTER — Ambulatory Visit (INDEPENDENT_AMBULATORY_CARE_PROVIDER_SITE_OTHER): Payer: No Typology Code available for payment source | Admitting: Family Medicine

## 2022-09-23 ENCOUNTER — Ambulatory Visit (INDEPENDENT_AMBULATORY_CARE_PROVIDER_SITE_OTHER): Payer: No Typology Code available for payment source

## 2022-09-23 ENCOUNTER — Encounter: Payer: Self-pay | Admitting: Family Medicine

## 2022-09-23 ENCOUNTER — Ambulatory Visit: Payer: Self-pay

## 2022-09-23 VITALS — BP 122/84 | HR 77 | Ht 71.0 in | Wt 228.0 lb

## 2022-09-23 DIAGNOSIS — S83242A Other tear of medial meniscus, current injury, left knee, initial encounter: Secondary | ICD-10-CM | POA: Diagnosis not present

## 2022-09-23 DIAGNOSIS — M25562 Pain in left knee: Secondary | ICD-10-CM

## 2022-09-23 DIAGNOSIS — M25561 Pain in right knee: Secondary | ICD-10-CM | POA: Diagnosis not present

## 2022-09-23 NOTE — Patient Instructions (Addendum)
Good to see you! Do prescribed exercises at least 3x a week

## 2022-09-23 NOTE — Assessment & Plan Note (Signed)
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  We discussed with patient about different treatment options and patient would like to try conservative therapy.  Done well previously.  Encouraged him to continue to monitor.  We will get x-rays with patient's history of the renal cell carcinoma.  Follow-up with me again in 6 to 8 weeks of worsening pain consider formal physical therapy, injections or any abnormal findings on x-ray will consider advanced imaging.

## 2022-10-09 ENCOUNTER — Other Ambulatory Visit (HOSPITAL_BASED_OUTPATIENT_CLINIC_OR_DEPARTMENT_OTHER): Payer: Self-pay

## 2022-10-09 MED ORDER — COMIRNATY 30 MCG/0.3ML IM SUSY
PREFILLED_SYRINGE | INTRAMUSCULAR | 0 refills | Status: DC
  Filled 2022-10-09: qty 0.3, 1d supply, fill #0

## 2022-10-18 ENCOUNTER — Encounter: Payer: Self-pay | Admitting: Internal Medicine

## 2022-10-20 ENCOUNTER — Encounter: Payer: Self-pay | Admitting: Internal Medicine

## 2022-10-20 ENCOUNTER — Ambulatory Visit (INDEPENDENT_AMBULATORY_CARE_PROVIDER_SITE_OTHER): Payer: No Typology Code available for payment source | Admitting: Internal Medicine

## 2022-10-20 VITALS — BP 138/84 | HR 76 | Temp 98.5°F | Ht 71.0 in | Wt 229.0 lb

## 2022-10-20 DIAGNOSIS — R0789 Other chest pain: Secondary | ICD-10-CM | POA: Diagnosis not present

## 2022-10-20 DIAGNOSIS — I10 Essential (primary) hypertension: Secondary | ICD-10-CM

## 2022-10-20 DIAGNOSIS — R000 Tachycardia, unspecified: Secondary | ICD-10-CM | POA: Insufficient documentation

## 2022-10-20 NOTE — Progress Notes (Signed)
Subjective:    Patient ID: Richard [name], male    DOB: 1986-04-11, 36 y.o.   MRN: [id number]      HPI EDD is here for  Chief Complaint  Patient presents with   Elevated heart rate    Elevated heart-rate; Tight discomfort on the left side (comes and goes)    Elevated heart rate recently.  10/18/2022-plays golf with a cart about 30 minutes afterwards when riding in the car his heart rate was around 100 bpm.  He did feel an anxious feeling and felt his heart beating higher. .  When he got home he sat down to drink more water and it did drop to 80 - 100 bpm, which is high for him just sitting around.  All night it was still on the high side - 100's.   Typical resting heart rate in high 50s.  Recently he has had some nausea and regurgitation when running.  When running recently he noticed heart rate was in the 170s-he will walk for a little bit and it would come back down.  When his HR is that high he feels nauseous and has some indigestion.   Started running about one month ago.    Has occ tightness in his left peck - it comes and goes.  He has notice it more recently - it goes away.  It is when he is active.    Maximum heart rate 220 - age of 62 = 183   No otc meds,  no change in coffee - only drinks 2 cups.  Watching salt in diet.  Drinking lots of water.  He has not been checking his blood pressure at home on a regular basis-has appointment next month for routine follow-up of his blood pressure   Medications and allergies reviewed with patient and updated if appropriate.  Current Outpatient Medications on File Prior to Visit  Medication Sig Dispense Refill   amLODipine (NORVASC) 5 MG tablet Take 1 tablet (5 mg total) by mouth daily. 90 tablet 3   Diclofenac Sodium 2 % SOLN Apply topically.     fluticasone (FLONASE) 50 MCG/ACT nasal spray Place 1 spray into both nostrils daily. 16 g 0   tadalafil (CIALIS) 20 MG tablet TAKE 1 TABLET BY MOUTH DAILY AS NEEDED 30 tablet  5   No current facility-administered medications on file prior to visit.    Review of Systems  Constitutional:  Negative for fever.  Respiratory:  Positive for shortness of breath (only with exercise - ? related to not being in shape). Negative for cough and wheezing.   Cardiovascular:  Positive for chest pain (left sided tightness). Negative for palpitations and leg swelling.  Gastrointestinal:        Reflux with exercise  Neurological:  Negative for light-headedness and headaches.       Objective:   Vitals:   10/20/22 0948  BP: 138/84  Pulse: 76  Temp: 98.5 F (36.9 C)  SpO2: 96%   BP Readings from Last 3 Encounters:  10/20/22 138/84  09/23/22 122/84  05/26/22 (!) 142/90   Wt Readings from Last 3 Encounters:  10/20/22 229 lb (103.9 kg)  09/23/22 228 lb (103.4 kg)  05/26/22 227 lb (103 kg)   Body mass index is 31.94 kg/m.    Physical Exam Constitutional:      General: He is not in acute distress.    Appearance: Normal appearance. He is not ill-appearing.  HENT:     Head: Normocephalic and  atraumatic.  Eyes:     Conjunctiva/sclera: Conjunctivae normal.  Cardiovascular:     Rate and Rhythm: Normal rate and regular rhythm.     Heart sounds: Normal heart sounds. No murmur heard. Pulmonary:     Effort: Pulmonary effort is normal. No respiratory distress.     Breath sounds: Normal breath sounds. No wheezing or rales.  Musculoskeletal:        General: No tenderness (No chest wall tenderness).     Right lower leg: No edema.     Left lower leg: No edema.  Skin:    General: Skin is warm and dry.     Findings: No rash.  Neurological:     Mental Status: He is alert. Mental status is at baseline.  Psychiatric:        Mood and Affect: Mood normal.            Assessment & Plan:    See Problem List for Assessment and Plan of chronic medical problems.

## 2022-10-20 NOTE — Assessment & Plan Note (Signed)
New Episodes of what sounds like sinus tachycardia-in normal rhythm on his watch and inappropriate elevation of heart rate No obvious cause EKG here today NSR with sinus arrhythmia at 64 bpm, normal EKG.  No prior EKG for comparison Blood work 5 months ago was all normal and we will hold off on repeating today Echocardiogram ordered We will refer to cardiology for further evaluation

## 2022-10-20 NOTE — Assessment & Plan Note (Signed)
New Has had some intermittent left-sided chest discomfort with activity Likely low risk for CAD but could consider CT coronary calcium score EKG today with sinus arrhythmia, otherwise normal Echocardiogram ordered We will refer to cardiology

## 2022-10-20 NOTE — Patient Instructions (Addendum)
    An EKG was done today.    An Echocardiogram or ultrasound of your heart was ordered.    Medications changes include :   none   A referral was ordered for Cardiology.     Someone will call you to schedule an appointment.     Monitor BP at home - goal is < 130/80

## 2022-10-20 NOTE — Assessment & Plan Note (Signed)
Chronic Not ideally controlled Discussed goal of less than 130/80 especially given only having 1 kidney Continue to work on weight loss, exercise regularly, low-sodium diet He will monitor his blood pressure regularly at home-we have follow-up next month and if still elevated we will need to adjust his medication Continue amlodipine 5 mg daily

## 2022-10-21 ENCOUNTER — Other Ambulatory Visit (HOSPITAL_BASED_OUTPATIENT_CLINIC_OR_DEPARTMENT_OTHER): Payer: Self-pay

## 2022-10-21 ENCOUNTER — Encounter: Payer: Self-pay | Admitting: Cardiovascular Disease

## 2022-10-21 ENCOUNTER — Ambulatory Visit (INDEPENDENT_AMBULATORY_CARE_PROVIDER_SITE_OTHER): Payer: No Typology Code available for payment source

## 2022-10-21 ENCOUNTER — Ambulatory Visit: Payer: No Typology Code available for payment source | Attending: Cardiology | Admitting: Cardiovascular Disease

## 2022-10-21 VITALS — BP 150/92 | HR 73 | Ht 71.0 in | Wt 230.2 lb

## 2022-10-21 DIAGNOSIS — R000 Tachycardia, unspecified: Secondary | ICD-10-CM | POA: Diagnosis not present

## 2022-10-21 DIAGNOSIS — Z01812 Encounter for preprocedural laboratory examination: Secondary | ICD-10-CM

## 2022-10-21 DIAGNOSIS — R072 Precordial pain: Secondary | ICD-10-CM | POA: Diagnosis not present

## 2022-10-21 MED ORDER — METOPROLOL TARTRATE 100 MG PO TABS
100.0000 mg | ORAL_TABLET | Freq: Once | ORAL | 0 refills | Status: DC
  Filled 2022-10-21: qty 1, 1d supply, fill #0

## 2022-10-21 NOTE — Patient Instructions (Addendum)
Medication Instructions:  No changes *If you need a refill on your cardiac medications before your next appointment, please call your pharmacy*   Lab Work: Today: BMET   Testing/Procedures: Cardiac CTA - see instructions below  3 day Zio heart monitor - see instructions below  Please schedule echo before you leave today. (Ordered by PCP)   Follow-Up: As needed    Your cardiac CT will be scheduled at  [facility] [place], [place] [address] [phone]   If scheduled at [facility], please arrive at the [facility] Entrance (Entrance C2) of [facility] 30 minutes prior to test start time. You can use the FREE valet parking offered at entrance C (encouraged to control the heart rate for the test)  Proceed to [facility] (first floor) to check-in and test prep.  All radiology patients and guests should use entrance C2 at [facility], accessed from [facility], even though the hospital's physical address listed is [address]     Please follow these instructions carefully (unless otherwise directed):  Hold all erectile dysfunction medications at least 3 days (72 hrs) prior to test. (Ie viagra, cialis, sildenafil, tadalafil, etc) We will administer nitroglycerin during this exam.   On the Night Before the Test: Be sure to Drink plenty of water. Do not consume any caffeinated/decaffeinated beverages or chocolate 12 hours prior to your test. Do not take any antihistamines 12 hours prior to your test.  On the Day of the Test: Drink plenty of water until 1 hour prior to the test. Do not eat any food 1 hour prior to test. You may take your regular medications prior to the test.  Take metoprolol (Lopressor) two hours prior to test.       After the Test: Drink plenty of water. After receiving IV contrast, you may experience a mild flushed feeling. This is  normal. On occasion, you may experience a mild rash up to 24 hours after the test. This is not dangerous. If this occurs, you can take Benadryl 25 mg and increase your fluid intake. If you experience trouble breathing, this can be serious. If it is severe call 911 IMMEDIATELY. If it is mild, please call our office. If you take any of these medications: Glipizide/Metformin, Avandament, Glucavance, please do not take 48 hours after completing test unless otherwise instructed.  We will call to schedule your test 2-4 weeks out understanding that some insurance companies will need an authorization prior to the service being performed.   For non-scheduling related questions, please contact the cardiac imaging nurse navigator should you have any questions/concerns: Marchia Bond, Cardiac Imaging Nurse Navigator Gordy Clement, Cardiac Imaging Nurse Navigator [facility] Direct Office Dial: [phone]   For scheduling needs, including cancellations and rescheduling, please call [place], [phone].  ZIO XT- Long Term Monitor Instructions  Your physician has requested you wear a ZIO patch monitor for 3 days.  This is a single patch monitor. [employer] supplies one patch monitor per enrollment. Additional stickers are not available. Please do not apply patch if you will be having a Nuclear Stress Test,  Echocardiogram, Cardiac CT, MRI, or Chest Xray during the period you would be wearing the  monitor. The patch cannot be worn during these tests. You cannot remove and re-apply the  ZIO XT patch monitor.  Your ZIO patch monitor will be mailed 3 day USPS to your address on  file. It may take 3-5 days  to receive your monitor after you have been enrolled.  Once you have received your monitor, please review the enclosed instructions. Your monitor  has already been registered assigning a specific monitor serial # to you.  Billing and Patient Assistance Program Information  We  have supplied [employer] with any of your insurance information on file for billing purposes. [employer] offers a sliding scale Patient Assistance Program for patients that do not have  insurance, or whose insurance does not completely cover the cost of the ZIO monitor.  You must apply for the Patient Assistance Program to qualify for this discounted rate.  To apply, please call [employer] at [phone], select option 4, select option 2, ask to apply for  Patient Assistance Program. Theodore Demark will ask your household income, and how many people  are in your household. They will quote your out-of-pocket cost based on that information.  [employer] will also be able to set up a 34-month interest-free payment plan if needed.  Applying the monitor   Shave hair from upper left chest.  Hold abrader disc by orange tab. Rub abrader in 40 strokes over the upper left chest as  indicated in your monitor instructions.  Clean area with 4 enclosed alcohol pads. Let dry.  Apply patch as indicated in monitor instructions. Patch will be placed under collarbone on left  side of chest with arrow pointing upward.  Rub patch adhesive wings for 2 minutes. Remove white label marked "1". Remove the white  label marked "2". Rub patch adhesive wings for 2 additional minutes.  While looking in a mirror, press and release button in center of patch. A small green light will  flash 3-4 times. This will be your only indicator that the monitor has been turned on.  Do not shower for the first 24 hours. You may shower after the first 24 hours.  Press the button if you feel a symptom. You will hear a small click. Record Date, Time and  Symptom in the Patient Logbook.  When you are ready to remove the patch, follow instructions on the last 2 pages of Patient  Logbook. Stick patch monitor onto the last page of Patient Logbook.  Place Patient Logbook in the blue and white box. Use locking tab on box and tape box closed  securely. The blue  and white box has prepaid postage on it. Please place it in the mailbox as  soon as possible. Your physician should have your test results approximately 7 days after the  monitor has been [facility]  Call [employer] [phone] you have questions regarding  your ZIO XT patch monitor. Call them immediately if you see an orange light blinking on your  monitor.  If your monitor falls off in less than 4 days, contact our Monitor department at [phone]  If your monitor becomes loose or falls off after 4 days call [employer] at [phone]  suggestions on securing your monitor

## 2022-10-21 NOTE — Progress Notes (Unsigned)
Enrolled patient for a 3 day Zio XT monitor to be mailed to patients home  

## 2022-10-21 NOTE — Progress Notes (Signed)
Chief Complaint  Patient presents with   New Patient (Initial Visit)    Chest pain    History of Present Illness: 36 yo male with history of renal cell carcinoma who is here today as a new patient for the evaluation of chest pain and tachycardia. He tells me today that he is very active. He has been having left sided chest pain with peak exertion, especially when running and getting his heart rate up. He was playing golf last weekend and his heart was racing as he was sitting in the golf cart. He has been having left sided chest pain with exertion.   Primary Care Physician: Binnie Rail, MD   Past Medical History:  Diagnosis Date   HTN (hypertension)    Renal cell carcinoma (HCC)    Stress fracture, right ankle, sequela    Subluxation of extensor carpi ulnaris tendon, right, sequela     Past Surgical History:  Procedure Laterality Date   ROBOT ASSISTED LAPAROSCOPIC NEPHRECTOMY Right 01/30/2022   Procedure: XI ROBOTIC ASSISTED LAPAROSCOPIC NEPHRECTOMY;  Surgeon: Alexis Frock, MD;  Location: [facility];  Service: Urology;  Laterality: Right;   TONSILLECTOMY AND ADENOIDECTOMY  1996   WISDOM TOOTH EXTRACTION      Current Outpatient Medications  Medication Sig Dispense Refill   amLODipine (NORVASC) 5 MG tablet Take 1 tablet (5 mg total) by mouth daily. 90 tablet 3   Diclofenac Sodium 2 % SOLN Apply topically.     fluticasone (FLONASE) 50 MCG/ACT nasal spray Place 1 spray into both nostrils daily. 16 g 0   metoprolol tartrate (LOPRESSOR) 100 MG tablet Take 1 tablet (100 mg total) by mouth once for 1 dose. Take 90-120 minutes prior to scan. 1 tablet 0   tadalafil (CIALIS) 20 MG tablet TAKE 1 TABLET BY MOUTH DAILY AS NEEDED 30 tablet 5   No current facility-administered medications for this visit.    Allergies  Allergen Reactions   Prednisone Palpitations    Racing heart    Social History   Socioeconomic History   Marital status: Married    Spouse name: Not on file    Number of children: 0   Years of education: Not on file   Highest education level: Master's degree (e.g., MA, MS, MEng, MEd, MSW, MBA)  Occupational History   Occupation: Marketing  Tobacco Use   Smoking status: Never   Smokeless tobacco: Never  Vaping Use   Vaping Use: Never used  Substance and Sexual Activity   Alcohol use: Yes    Comment: social on weekend   Drug use: Never   Sexual activity: Yes    Partners: Female  Other Topics Concern   Not on file  Social History Narrative   Not on file   Social Determinants of Health   Financial Resource Strain: Low Risk  (11/12/2021)   Overall Financial Resource Strain (CARDIA)    Difficulty of Paying Living Expenses: Not hard at all  Food Insecurity: No Food Insecurity (11/12/2021)   Hunger Vital Sign    Worried About Running Out of Food in the Last Year: Never true    [place] in the Last Year: Never true  Transportation Needs: No Transportation Needs (11/12/2021)   PRAPARE - Hydrologist (Medical): No    Lack of Transportation (Non-Medical): No  Physical Activity: Sufficiently Active (11/12/2021)   Exercise Vital Sign    Days of Exercise per Week: 5 days    Minutes  of Exercise per Session: 30 min  Stress: No Stress Concern Present (11/12/2021)   [place]    Feeling of Stress : Only a little  Social Connections: Unknown (11/12/2021)   Social Connection and Isolation Panel [NHANES]    Frequency of Communication with Friends and Family: More than three times a week    Frequency of Social Gatherings with Friends and Family: Twice a week    Attends Religious Services: Patient refused    Marine scientist or Organizations: No    Attends Music therapist: Not on file    Marital Status: Married  Human resources officer Violence: Not on file    Family History  Problem Relation Age of Onset   Hyperlipidemia Mother     Hypertension Mother    Hypertension Father    Arthritis-Osteo Father    Hearing loss Father    High blood pressure Father    High Cholesterol Father    Glaucoma Father    Prostate cancer Father 10   Asthma Sister    Glaucoma Sister    Polycystic ovary syndrome Sister    Hearing loss Maternal Grandmother    Heart disease Maternal Grandmother 80   High blood pressure Maternal Grandmother    High Cholesterol Maternal Grandmother    Stroke Maternal Grandmother 80       TIAs   Heart attack Maternal Grandfather 54   High Cholesterol Maternal Grandfather    Cancer Paternal Grandmother        possibly had lung cancer, smoked   Hearing loss Paternal Grandfather    Heart Problems Maternal Uncle        ?left ventricular issues, ICD   High Cholesterol Maternal Uncle    Bladder Cancer Paternal Uncle    Prostate cancer Paternal Uncle    Bladder Cancer Paternal Uncle        dx. late 3s   Pancreatic cancer Maternal Great-grandfather 74   Stomach cancer Other    Pancreatic cancer Other     Review of Systems:  As stated in the HPI and otherwise negative.   BP (!) 150/92   Pulse 73   Ht '5\' 11"'$  (1.803 m)   Wt 230 lb 3.2 oz (104.4 kg)   SpO2 97%   BMI 32.11 kg/m   Physical Examination: General: Well developed, well nourished, NAD  HEENT: OP clear, mucus membranes moist  SKIN: warm, dry. No rashes. Neuro: No focal deficits  Musculoskeletal: Muscle strength 5/5 all ext  Psychiatric: Mood and affect normal  Neck: No JVD, no carotid bruits, no thyromegaly, no lymphadenopathy.  Lungs:Clear bilaterally, no wheezes, rhonci, crackles Cardiovascular: Regular rate and rhythm. No murmurs, gallops or rubs. Abdomen:Soft. Bowel sounds present. Non-tender.  Extremities: No lower extremity edema. Pulses are 2 + in the bilateral DP/PT.  EKG:  EKG is not ordered today. The ekg from yesterday shows sinus rhythm  Recent Labs: 05/26/2022: ALT 17; BUN 21; Creatinine, Ser 1.44; Hemoglobin 14.3;  Platelets 203.0; Potassium 4.4; Sodium 136; TSH 1.79   Lipid Panel    Component Value Date/Time   CHOL 203 (H) 05/26/2022 0944   TRIG 111.0 05/26/2022 0944   HDL 48.90 05/26/2022 0944   CHOLHDL 4 05/26/2022 0944   VLDL 22.2 05/26/2022 0944   LDLCALC 132 (H) 05/26/2022 0944     Wt Readings from Last 3 Encounters:  10/21/22 230 lb 3.2 oz (104.4 kg)  10/20/22 229 lb (103.9 kg)  09/23/22 228 lb (  103.4 kg)    Assessment and Plan:   1. Chest pain 2. Tachycardia  Coronary CTA to assess for CAD. Echo to assess LVEF and exclude structural heart disease. 3 day Zio cardiac monitor to exclude arrhythmias.  BMET today  Labs/ tests ordered today include:   Orders Placed This Encounter  Procedures   CT CORONARY MORPH W/CTA COR W/SCORE W/CA W/CM &/OR WO/CM   Basic Metabolic Panel (BMET)   LONG TERM MONITOR (3-14 DAYS)     Disposition:   F/U with me in 4-8 weeks    Signed, Lauree Chandler, MD, [facility] 10/21/2022 3:45 PM    [facility] [place], [place], [place]  [address] Phone: [phone]; Fax: [phone]

## 2022-10-22 LAB — BASIC METABOLIC PANEL
BUN/Creatinine Ratio: 10 (ref 9–20)
BUN: 14 mg/dL (ref 6–20)
CO2: 27 mmol/L (ref 20–29)
Calcium: 9.5 mg/dL (ref 8.7–10.2)
Chloride: 98 mmol/L (ref 96–106)
Creatinine, Ser: 1.41 mg/dL — ABNORMAL HIGH (ref 0.76–1.27)
Glucose: 99 mg/dL (ref 70–99)
Potassium: 4.1 mmol/L (ref 3.5–5.2)
Sodium: 140 mmol/L (ref 134–144)
eGFR: 66 mL/min/{1.73_m2} (ref >59)

## 2022-10-23 DIAGNOSIS — R000 Tachycardia, unspecified: Secondary | ICD-10-CM | POA: Diagnosis not present

## 2022-10-26 ENCOUNTER — Other Ambulatory Visit (HOSPITAL_BASED_OUTPATIENT_CLINIC_OR_DEPARTMENT_OTHER): Payer: Self-pay

## 2022-10-26 ENCOUNTER — Telehealth (HOSPITAL_COMMUNITY): Payer: Self-pay | Admitting: Emergency Medicine

## 2022-10-26 NOTE — Telephone Encounter (Signed)
Patient coming for CCTA at 1200 for [facility] [phone] Office  [phone] Cell

## 2022-10-27 ENCOUNTER — Ambulatory Visit (HOSPITAL_COMMUNITY): Admission: RE | Admit: 2022-10-27 | Payer: No Typology Code available for payment source | Source: Ambulatory Visit

## 2022-10-27 ENCOUNTER — Ambulatory Visit (HOSPITAL_COMMUNITY)
Admission: RE | Admit: 2022-10-27 | Discharge: 2022-10-27 | Disposition: A | Discharge status: Home / Self Care | Payer: No Typology Code available for payment source | Source: Ambulatory Visit | Attending: Cardiovascular Disease | Admitting: Cardiovascular Disease

## 2022-10-27 DIAGNOSIS — R000 Tachycardia, unspecified: Secondary | ICD-10-CM | POA: Insufficient documentation

## 2022-10-27 DIAGNOSIS — R072 Precordial pain: Secondary | ICD-10-CM | POA: Insufficient documentation

## 2022-10-27 MED ORDER — NITROGLYCERIN 0.4 MG SL SUBL
0.8000 mg | SUBLINGUAL_TABLET | Freq: Once | SUBLINGUAL | Status: AC
  Administered 2022-10-27: 0.8 mg via SUBLINGUAL

## 2022-10-27 MED ORDER — NITROGLYCERIN 0.4 MG SL SUBL
SUBLINGUAL_TABLET | SUBLINGUAL | Status: AC
  Filled 2022-10-27: qty 2

## 2022-10-27 MED ORDER — IOHEXOL 350 MG/ML SOLN
100.0000 mL | Freq: Once | INTRAVENOUS | Status: AC | PRN
  Administered 2022-10-27: 100 mL via INTRAVENOUS

## 2022-11-04 ENCOUNTER — Encounter: Payer: Self-pay | Admitting: Family Medicine

## 2022-11-05 ENCOUNTER — Ambulatory Visit (HOSPITAL_COMMUNITY): Payer: No Typology Code available for payment source

## 2022-11-06 ENCOUNTER — Ambulatory Visit (HOSPITAL_COMMUNITY)
Admission: RE | Admit: 2022-11-06 | Discharge: 2022-11-06 | Disposition: A | Discharge status: Home / Self Care | Payer: No Typology Code available for payment source | Source: Ambulatory Visit | Attending: Internal Medicine | Admitting: Internal Medicine

## 2022-11-06 ENCOUNTER — Ambulatory Visit: Payer: Self-pay

## 2022-11-06 ENCOUNTER — Encounter: Payer: Self-pay | Admitting: Family Medicine

## 2022-11-06 ENCOUNTER — Ambulatory Visit (INDEPENDENT_AMBULATORY_CARE_PROVIDER_SITE_OTHER): Payer: No Typology Code available for payment source | Admitting: Family Medicine

## 2022-11-06 VITALS — BP 120/82 | HR 86 | Ht 71.0 in | Wt 231.0 lb

## 2022-11-06 DIAGNOSIS — I10 Essential (primary) hypertension: Secondary | ICD-10-CM | POA: Diagnosis not present

## 2022-11-06 DIAGNOSIS — M25562 Pain in left knee: Secondary | ICD-10-CM | POA: Diagnosis not present

## 2022-11-06 DIAGNOSIS — S83282A Other tear of lateral meniscus, current injury, left knee, initial encounter: Secondary | ICD-10-CM

## 2022-11-06 DIAGNOSIS — G8929 Other chronic pain: Secondary | ICD-10-CM

## 2022-11-06 DIAGNOSIS — R0789 Other chest pain: Secondary | ICD-10-CM | POA: Diagnosis not present

## 2022-11-06 DIAGNOSIS — R000 Tachycardia, unspecified: Secondary | ICD-10-CM | POA: Diagnosis not present

## 2022-11-06 NOTE — Progress Notes (Signed)
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  We discussed with patient about different treatment options and patient would like to try conservative therapy.  Done well previously.  Encouraged him to continue to monitor.  We will get x-rays with patient's history of the renal cell carcinoma.  Follow-up with me again in 6 to 8 weeks of worsening pain consider formal physical therapy, injections or any abnormal findings on x-ray will consider advanced imaging.     Update 11/06/2022 Gilford Rile LOEWEN is a 36 y.o. male coming in with complaint of B knee pain. Patient states that he has been doing well. Ran [place] trot and able to play golf with less pain. Using topical analgesic for pain relief.    Xray L knee 09/23/2022 IMPRESSION: Mild tricompartmental peripheral spurring with trace knee joint effusion.  Xray R knee 09/23/2022 IMPRESSION: Trace patellofemoral spurring with small knee joint effusion.    Past Medical History:  Diagnosis Date   HTN (hypertension)    Renal cell carcinoma (HCC)    Stress fracture, right ankle, sequela    Subluxation of extensor carpi ulnaris tendon, right, sequela    Past Surgical History:  Procedure Laterality Date   ROBOT ASSISTED LAPAROSCOPIC NEPHRECTOMY Right 01/30/2022   Procedure: XI ROBOTIC ASSISTED LAPAROSCOPIC NEPHRECTOMY;  Surgeon: Alexis Frock, MD;  Location: [facility];  Service: Urology;  Laterality: Right;   TONSILLECTOMY AND ADENOIDECTOMY  1996   WISDOM TOOTH EXTRACTION     Social History   Socioeconomic History   Marital status: Married    Spouse name: Not on file   Number of children: 0   Years  of education: Not on file   Highest education level: Master's degree (e.g., MA, MS, MEng, MEd, MSW, MBA)  Occupational History   Occupation: Marketing  Tobacco Use   Smoking status: Never   Smokeless tobacco: Never  Vaping Use   Vaping Use: Never used  Substance and Sexual Activity   Alcohol use: Yes    Comment: social on weekend   Drug use: Never   Sexual activity: Yes    Partners: Female  Other Topics Concern   Not on file  Social History Narrative   Not on file   Social Determinants of Health   Financial Resource Strain: Low Risk  (11/12/2021)   Overall Financial Resource Strain (CARDIA)    Difficulty of Paying Living Expenses: Not hard at all  Food Insecurity: No Food Insecurity (11/12/2021)   Hunger Vital Sign    Worried About Running Out of Food in the Last Year: Never true    [place] in the Last Year: Never true  Transportation Needs: No Transportation Needs (11/12/2021)   PRAPARE - Hydrologist (Medical): No    Lack of Transportation (Non-Medical): No  Physical Activity: Sufficiently Active (11/12/2021)   Exercise Vital Sign    Days of Exercise per Week: 5 days    Minutes of Exercise per Session: 30 min  Stress: No Stress Concern Present (11/12/2021)   [place]    Feeling of Stress : Only a little  Social  Connections: Unknown (11/12/2021)   Social Connection and Isolation Panel [NHANES]    Frequency of Communication with Friends and Family: More than three times a week    Frequency of Social Gatherings with Friends and Family: Twice a week    Attends Religious Services: Patient refused    Marine scientist or Organizations: No    Attends Music therapist: Not on file    Marital Status: Married   Allergies  Allergen Reactions   Prednisone Palpitations    Racing heart   Family History  Problem Relation Age of Onset   Hyperlipidemia Mother     Hypertension Mother    Hypertension Father    Arthritis-Osteo Father    Hearing loss Father    High blood pressure Father    High Cholesterol Father    Glaucoma Father    Prostate cancer Father 33   Asthma Sister    Glaucoma Sister    Polycystic ovary syndrome Sister    Hearing loss Maternal Grandmother    Heart disease Maternal Grandmother 80   High blood pressure Maternal Grandmother    High Cholesterol Maternal Grandmother    Stroke Maternal Grandmother 80       TIAs   Heart attack Maternal Grandfather 54   High Cholesterol Maternal Grandfather    Cancer Paternal Grandmother        possibly had lung cancer, smoked   Hearing loss Paternal Grandfather    Heart Problems Maternal Uncle        ?left ventricular issues, ICD   High Cholesterol Maternal Uncle    Bladder Cancer Paternal Uncle    Prostate cancer Paternal Uncle    Bladder Cancer Paternal Uncle        dx. late 47s   Pancreatic cancer Maternal Great-grandfather 48   Stomach cancer Other    Pancreatic cancer Other      Current Outpatient Medications (Cardiovascular):    amLODipine (NORVASC) 5 MG tablet, Take 1 tablet (5 mg total) by mouth daily.   tadalafil (CIALIS) 20 MG tablet, TAKE 1 TABLET BY MOUTH DAILY AS NEEDED   metoprolol tartrate (LOPRESSOR) 100 MG tablet, Take 1 tablet (100 mg total) by mouth once for 1 dose. Take 90-120 minutes prior to scan.  Current Outpatient Medications (Respiratory):    fluticasone (FLONASE) 50 MCG/ACT nasal spray, Place 1 spray into both nostrils daily.    Current Outpatient Medications (Other):    Diclofenac Sodium 2 % SOLN, Apply topically.   Reviewed prior external information including notes and imaging from  primary care provider As well as notes that were available from care everywhere and other healthcare systems.  Past medical history, social, surgical and family history all reviewed in electronic medical record.  No pertanent information unless stated regarding  to the chief complaint.   Review of Systems:  No headache, visual changes, nausea, vomiting, diarrhea, constipation, dizziness, abdominal pain, skin rash, fevers, chills, night sweats, weight loss, swollen lymph nodes, body aches, joint swelling, chest pain, shortness of breath, mood changes. POSITIVE muscle aches  Objective  Blood pressure 120/82, pulse 86, height '5\' 11"'$  (1.803 m), weight 231 lb (104.8 kg), SpO2 97 %.   General: No apparent distress alert and oriented x3 mood and affect normal, dressed appropriately.  HEENT: Pupils equal, extraocular movements intact  Respiratory: Patient's speak in full sentences and does not appear short of breath  Cardiovascular: No lower extremity edema, non tender, no erythema  Left knee exam has good range of  motion noted.  Very mild discomfort noted over the medial joint line.  Patient has no significant crepitus noted at the moment.   Limited muscular skeletal ultrasound was performed and interpreted by Hulan Saas, M  Limited ultrasound of patient's left knee shows still some degenerative changes of the medial meniscus.  The patient has trace hypoechoic changes in the patellofemoral joint.  Otherwise fairly unremarkable. Impression: As above   Impression and Recommendations:    The above documentation has been reviewed and is accurate and complete Lyndal Pulley, DO

## 2022-11-06 NOTE — Assessment & Plan Note (Signed)
No reaccumulation of the Baker's cyst.  Patient does not have any locking or giving out on him and I do think the patient can do relatively well with conservative therapy at this time.  Discussed icing regimen and home exercises.  Discussed still being aware of full flexion extension and twisting motions.  Follow-up with me again in 6 to 8 weeks.

## 2022-11-07 LAB — ECHOCARDIOGRAM COMPLETE
Area-P 1/2: 2.61 cm2
Height: 71 in
S' Lateral: 3.2 cm
Weight: 3696 oz

## 2022-11-09 ENCOUNTER — Encounter: Payer: Self-pay | Admitting: Cardiovascular Disease

## 2022-11-09 ENCOUNTER — Other Ambulatory Visit (HOSPITAL_BASED_OUTPATIENT_CLINIC_OR_DEPARTMENT_OTHER): Payer: No Typology Code available for payment source

## 2022-11-10 ENCOUNTER — Other Ambulatory Visit: Payer: Self-pay | Admitting: *Deleted

## 2022-11-10 DIAGNOSIS — I77810 Thoracic aortic ectasia: Secondary | ICD-10-CM

## 2022-11-10 NOTE — Progress Notes (Signed)
Ct aorta due in one year/per coronary cta.

## 2022-11-10 NOTE — Telephone Encounter (Signed)
Called patient and reviewed results and addressed all questions/concerns.  Offered to bring him back for a visit to discuss further.  Declined need for visit at this time.  Will route results to PCP.

## 2022-11-12 ENCOUNTER — Ambulatory Visit: Payer: No Typology Code available for payment source | Admitting: Cardiology

## 2022-11-15 NOTE — Progress Notes (Unsigned)
Electrophysiology Office Note:    Date:  11/15/2022   ID:  ROBBY, BULKLEY Oct 02, 1986, MRN [id number]  PCP:  Binnie Rail, MD  [facility] Cardiologist:  None  [facility] Electrophysiologist:  None   Referring MD: Binnie Rail, MD   Chief Complaint: tachycardia  History of Present Illness:    Richard [name] is a 36 y.o. male who presents for an evaluation of tachycardia at the request of Dr Quay Burow. Their medical history includes RCC, HTN. He saw Dr Angelena Form 10/21/2022. Mr Hardaway is active. He does report some chest discomfort with peak exertion. He has experienced palpitations while at rest.   He had a coronary CTA and zio monitor ordered with Dr Angelena Form.        Past Medical History:  Diagnosis Date   HTN (hypertension)    Renal cell carcinoma (HCC)    Stress fracture, right ankle, sequela    Subluxation of extensor carpi ulnaris tendon, right, sequela     Past Surgical History:  Procedure Laterality Date   ROBOT ASSISTED LAPAROSCOPIC NEPHRECTOMY Right 01/30/2022   Procedure: XI ROBOTIC ASSISTED LAPAROSCOPIC NEPHRECTOMY;  Surgeon: Alexis Frock, MD;  Location: [facility];  Service: Urology;  Laterality: Right;   TONSILLECTOMY AND ADENOIDECTOMY  1996   WISDOM TOOTH EXTRACTION      Current Medications: No outpatient medications have been marked as taking for the 11/16/22 encounter (Appointment) with Vickie Epley, MD.     Allergies:   Prednisone   Social History   Socioeconomic History   Marital status: Married    Spouse name: Not on file   Number of children: 0   Years of education: Not on file   Highest education level: Master's degree (e.g., MA, MS, MEng, MEd, MSW, MBA)  Occupational History   Occupation: Marketing  Tobacco Use   Smoking status: Never   Smokeless tobacco: Never  Vaping Use   Vaping Use: Never used  Substance and Sexual Activity   Alcohol use: Yes    Comment: social on weekend   Drug use: Never   Sexual activity:  Yes    Partners: Female  Other Topics Concern   Not on file  Social History Narrative   Not on file   Social Determinants of Health   Financial Resource Strain: Low Risk  (11/12/2021)   Overall Financial Resource Strain (CARDIA)    Difficulty of Paying Living Expenses: Not hard at all  Food Insecurity: No Food Insecurity (11/12/2021)   Hunger Vital Sign    Worried About Running Out of Food in the Last Year: Never true    [place] in the Last Year: Never true  Transportation Needs: No Transportation Needs (11/12/2021)   PRAPARE - Hydrologist (Medical): No    Lack of Transportation (Non-Medical): No  Physical Activity: Sufficiently Active (11/12/2021)   Exercise Vital Sign    Days of Exercise per Week: 5 days    Minutes of Exercise per Session: 30 min  Stress: No Stress Concern Present (11/12/2021)   [place]    Feeling of Stress : Only a little  Social Connections: Unknown (11/12/2021)   Social Connection and Isolation Panel [NHANES]    Frequency of Communication with Friends and Family: More than three times a week    Frequency of Social Gatherings with Friends and Family: Twice a week    Attends Religious Services: Patient refused  Active Member of Clubs or Organizations: No    Attends Music therapist: Not on file    Marital Status: Married     Family History: The patient's family history includes Arthritis-Osteo in his father; Asthma in his sister; Bladder Cancer in his paternal uncle and paternal uncle; Cancer in his paternal grandmother; Glaucoma in his father and sister; Hearing loss in his father, maternal grandmother, and paternal grandfather; Heart Problems in his maternal uncle; Heart attack (age of onset: 29) in his maternal grandfather; Heart disease (age of onset: 70) in his maternal grandmother; High Cholesterol in his father, maternal grandfather,  maternal grandmother, and maternal uncle; High blood pressure in his father and maternal grandmother; Hyperlipidemia in his mother; Hypertension in his father and mother; Pancreatic cancer in an other family member; Pancreatic cancer (age of onset: 7) in his maternal great-grandfather; Polycystic ovary syndrome in his sister; Prostate cancer in his paternal uncle; Prostate cancer (age of onset: 16) in his father; Stomach cancer in an other family member; Stroke (age of onset: 63) in his maternal grandmother.  ROS:   Please see the history of present illness.    All other systems reviewed and are negative.  EKGs/Labs/Other Studies Reviewed:    The following studies were reviewed today:  11/08/2022 zio personally reviewed Mobitz present at night HR 27 - 175 bpm Rare PAC No AF  11/06/2022 echo - EF 55, RV normal, no MR  10/27/2022 CTA coronary No CAD, CAC 0, no anomalous coronaries Mid LAD myocardial bridging 83m depth  EKG:  The ekg ordered today demonstrates ***   Recent Labs: 05/26/2022: ALT 17; Hemoglobin 14.3; Platelets 203.0; TSH 1.79 10/21/2022: BUN 14; Creatinine, Ser 1.41; Potassium 4.1; Sodium 140  Recent Lipid Panel    Component Value Date/Time   CHOL 203 (H) 05/26/2022 0944   TRIG 111.0 05/26/2022 0944   HDL 48.90 05/26/2022 0944   CHOLHDL 4 05/26/2022 0944   VLDL 22.2 05/26/2022 0944   LDLCALC 132 (H) 05/26/2022 0944    Physical Exam:    VS:  There were no vitals taken for this visit.    Wt Readings from Last 3 Encounters:  11/06/22 231 lb (104.8 kg)  10/21/22 230 lb 3.2 oz (104.4 kg)  10/20/22 229 lb (103.9 kg)     GEN: *** Well nourished, well developed in no acute distress HEENT: Normal NECK: No JVD; No carotid bruits LYMPHATICS: No lymphadenopathy CARDIAC: ***RRR, no murmurs, rubs, gallops RESPIRATORY:  Clear to auscultation without rales, wheezing or rhonchi  ABDOMEN: Soft, non-tender, non-distended MUSCULOSKELETAL:  No edema; No deformity  SKIN:  Warm and dry NEUROLOGIC:  Alert and oriented x 3 PSYCHIATRIC:  Normal affect       ASSESSMENT:    No diagnosis found. PLAN:    In order of problems listed above:  #Palpitations   #Hypertension *** goal today.  Recommend checking blood pressures 1-2 times per week at home and recording the values.  Recommend bringing these recordings to the primary care physician.       Total time spent with patient today *** minutes. This includes reviewing records, evaluating the patient and coordinating care.  Medication Adjustments/Labs and Tests Ordered: Current medicines are reviewed at length with the patient today.  Concerns regarding medicines are outlined above.  No orders of the defined types were placed in this encounter.  No orders of the defined types were placed in this encounter.    Signed, CHilton Cork LQuentin Ore MD, [facility]12/09/2022 8:29 PM  Electrophysiology [facility]

## 2022-11-16 ENCOUNTER — Encounter: Payer: Self-pay | Admitting: Cardiology

## 2022-11-16 ENCOUNTER — Ambulatory Visit: Payer: No Typology Code available for payment source | Attending: Cardiology | Admitting: Cardiology

## 2022-11-16 VITALS — BP 124/82 | HR 65 | Ht 71.0 in | Wt 231.2 lb

## 2022-11-16 DIAGNOSIS — I77810 Thoracic aortic ectasia: Secondary | ICD-10-CM | POA: Diagnosis not present

## 2022-11-16 DIAGNOSIS — I10 Essential (primary) hypertension: Secondary | ICD-10-CM | POA: Diagnosis not present

## 2022-11-16 DIAGNOSIS — R000 Tachycardia, unspecified: Secondary | ICD-10-CM | POA: Diagnosis not present

## 2022-11-16 NOTE — Progress Notes (Signed)
Electrophysiology Office Note:    Date:  11/16/2022   ID:  TAELON, BENDORF 06/21/1986, MRN [id number]  PCP:  Binnie Rail, MD  [facility] Cardiologist:  None  [facility] Electrophysiologist:  Vickie Epley, MD   Referring MD: Binnie Rail, MD   Chief Complaint: tachycardia  History of Present Illness:    Richard [name] is a 36 y.o. male who presents for an evaluation of tachycardia at the request of Dr Quay Burow. Their medical history includes RCC, HTN. He saw Dr Angelena Form 10/21/2022. Mr [name] is active. He does report some chest discomfort with peak exertion. He has experienced palpitations while at rest.   He had a coronary CTA and zio monitor ordered with Dr Angelena Form.   Today, he is accompanied by a family member. In January he was diagnosed with renal cell carcinoma. He underwent right radical nephrectomy and follows with Dr. Tresa Moore. Since then, he has had intermittent tachycardic episodes with heart rates as high as the 170's associated with nausea and a squeezing sensation in his central chest. This had never lasted longer than a minute.  Previously his heart rate was about 58-70 bpm at rest. Since the onset of the tachycardic episodes his heart rate is typically in the 90s-100s. More recently, his episodes have been associated with a heart rate closer to 110 bpm. He still has an anxious/squeezing feeling during the episodes and is able to feel rapid palpitations. Many of the episodes will occur after heavier exertion. However, he recently had a few occurring some time after playing golf earlier in the day. His most recent episode was Saturday night; he lied down on the couch and it resolved spontaneously after about 30 minutes. Last night while playing tennis he felt fluttering palpitations, but no rapid heart rates.   In his family he endorses a significant family history of cardiovascular issues on his maternal and paternal sides.  He denies any shortness of  breath, or peripheral edema. No lightheadedness, headaches, syncope, orthopnea, or PND.      Past Medical History:  Diagnosis Date   HTN (hypertension)    Renal cell carcinoma (HCC)    Stress fracture, right ankle, sequela    Subluxation of extensor carpi ulnaris tendon, right, sequela     Past Surgical History:  Procedure Laterality Date   ROBOT ASSISTED LAPAROSCOPIC NEPHRECTOMY Right 01/30/2022   Procedure: XI ROBOTIC ASSISTED LAPAROSCOPIC NEPHRECTOMY;  Surgeon: Alexis Frock, MD;  Location: [facility];  Service: Urology;  Laterality: Right;   TONSILLECTOMY AND ADENOIDECTOMY  1996   WISDOM TOOTH EXTRACTION      Current Medications: Current Meds  Medication Sig   amLODipine (NORVASC) 5 MG tablet Take 1 tablet (5 mg total) by mouth daily.   Diclofenac Sodium 2 % SOLN Apply topically.   fluticasone (FLONASE) 50 MCG/ACT nasal spray Place 1 spray into both nostrils daily.   tadalafil (CIALIS) 20 MG tablet TAKE 1 TABLET BY MOUTH DAILY AS NEEDED     Allergies:   Prednisone   Social History   Socioeconomic History   Marital status: Married    Spouse name: Not on file   Number of children: 0   Years of education: Not on file   Highest education level: Master's degree (e.g., MA, MS, MEng, MEd, MSW, MBA)  Occupational History   Occupation: Marketing  Tobacco Use   Smoking status: Never   Smokeless tobacco: Never  Vaping Use   Vaping Use: Never used  Substance and Sexual  Activity   Alcohol use: Yes    Comment: social on weekend   Drug use: Never   Sexual activity: Yes    Partners: Female  Other Topics Concern   Not on file  Social History Narrative   Not on file   Social Determinants of Health   Financial Resource Strain: Low Risk  (11/12/2021)   Overall Financial Resource Strain (CARDIA)    Difficulty of Paying Living Expenses: Not hard at all  Food Insecurity: No Food Insecurity (11/12/2021)   Hunger Vital Sign    Worried About Running Out of Food in the Last Year:  Never true    Ran Out of Food in the Last Year: Never true  Transportation Needs: No Transportation Needs (11/12/2021)   PRAPARE - Hydrologist (Medical): No    Lack of Transportation (Non-Medical): No  Physical Activity: Sufficiently Active (11/12/2021)   Exercise Vital Sign    Days of Exercise per Week: 5 days    Minutes of Exercise per Session: 30 min  Stress: No Stress Concern Present (11/12/2021)   [place]    Feeling of Stress : Only a little  Social Connections: Unknown (11/12/2021)   Social Connection and Isolation Panel [NHANES]    Frequency of Communication with Friends and Family: More than three times a week    Frequency of Social Gatherings with Friends and Family: Twice a week    Attends Religious Services: Patient refused    Marine scientist or Organizations: No    Attends Music therapist: Not on file    Marital Status: Married     Family History: The patient's family history includes Arthritis-Osteo in his father; Asthma in his sister; Bladder Cancer in his paternal uncle and paternal uncle; Cancer in his paternal grandmother; Glaucoma in his father and sister; Hearing loss in his father, maternal grandmother, and paternal grandfather; Heart Problems in his maternal uncle; Heart attack (age of onset: 44) in his maternal grandfather; Heart disease (age of onset: 31) in his maternal grandmother; High Cholesterol in his father, maternal grandfather, maternal grandmother, and maternal uncle; High blood pressure in his father and maternal grandmother; Hyperlipidemia in his mother; Hypertension in his father and mother; Pancreatic cancer in an other family member; Pancreatic cancer (age of onset: 69) in his maternal great-grandfather; Polycystic ovary syndrome in his sister; Prostate cancer in his paternal uncle; Prostate cancer (age of onset: 75) in his father; Stomach  cancer in an other family member; Stroke (age of onset: 17) in his maternal grandmother.  ROS:   Please see the history of present illness.    (+) Palpitations (+) Nausea/Anxious feeling (+) Chest tightness All other systems reviewed and are negative.  EKGs/Labs/Other Studies Reviewed:    The following studies were reviewed today:  11/08/2022 zio personally reviewed Mobitz present at night HR 27 - 175 bpm Rare PAC No AF  11/06/2022 echo - EF 55, RV normal, no MR  10/27/2022 CTA coronary No CAD, CAC 0, no anomalous coronaries Mid LAD myocardial bridging 75m depth  EKG:  The ekg ordered today demonstrates sinus rhythm   Recent Labs: 05/26/2022: ALT 17; Hemoglobin 14.3; Platelets 203.0; TSH 1.79 10/21/2022: BUN 14; Creatinine, Ser 1.41; Potassium 4.1; Sodium 140   Recent Lipid Panel    Component Value Date/Time   CHOL 203 (H) 05/26/2022 0944   TRIG 111.0 05/26/2022 0944   HDL 48.90 05/26/2022 0944   CHOLHDL  4 05/26/2022 0944   VLDL 22.2 05/26/2022 0944   LDLCALC 132 (H) 05/26/2022 0944    Physical Exam:    VS:  BP 124/82   Pulse 65   Ht '5\' 11"'$  (1.803 m)   Wt 231 lb 3.2 oz (104.9 kg)   SpO2 97%   BMI 32.25 kg/m     Wt Readings from Last 3 Encounters:  11/16/22 231 lb 3.2 oz (104.9 kg)  11/06/22 231 lb (104.8 kg)  10/21/22 230 lb 3.2 oz (104.4 kg)     GEN: Well nourished, well developed in no acute distress HEENT: Normal NECK: No JVD; No carotid bruits LYMPHATICS: No lymphadenopathy CARDIAC: RRR, no murmurs, rubs, gallops RESPIRATORY:  Clear to auscultation without rales, wheezing or rhonchi  ABDOMEN: Soft, non-tender, non-distended MUSCULOSKELETAL:  No edema; No deformity  SKIN: Warm and dry NEUROLOGIC:  Alert and oriented x 3 PSYCHIATRIC:  Normal affect       ASSESSMENT:    1. Tachycardia   2. Primary hypertension   3. Aortic root dilatation (HCC)    PLAN:    In order of problems listed above:  #Palpitations Suspect he has rare PAC's and  PVC's that he can sense. No evidence of other SVT's or sinister arrhythmias. He is able to exercise with appropriate chronotropic response and no inducible arrhythmias. I do not think further evaluation is indicated at this time.  #Chest pain I do not suspect a cardiac cause of his chest pain. 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 I have encouraged him to continue exercising but avoid max heart rates given they reproduce his symptoms.   #Hypertension At goal today.  Recommend checking blood pressures 1-2 times per week at home and recording the values.  Recommend bringing these recordings to the primary care physician.  #Aortic dilation Will need repeat imaging (echo or cross sectional) in 1 year. I suspect he will have interval imaging for his RCC that could suffice. We will review imaging at our 1 year follow up and determine if any dedicated imaging is needed.  Follow-up in 1 year.   Total time spent with patient today 60 minutes. This includes reviewing records, evaluating the patient and coordinating care.  Medication Adjustments/Labs and Tests Ordered: Current medicines are reviewed at length with the patient today.  Concerns regarding medicines are outlined above.   No orders of the defined types were placed in this encounter.  No orders of the defined types were placed in this encounter.  I,Mathew Stumpf,acting as a Education administrator for Vickie Epley, MD.,have documented all relevant documentation on the behalf of Vickie Epley, MD,as directed by  Vickie Epley, MD while in the presence of Vickie Epley, MD.  I, Vickie Epley, MD, have reviewed all documentation for this visit. The documentation on 11/16/22 for the exam, diagnosis, procedures, and orders are all accurate and complete.   Signed, Hilton Cork. Quentin Ore, MD, [facility], [facility] 11/16/2022 5:01 PM    Electrophysiology [facility]  [facility]

## 2022-11-16 NOTE — Patient Instructions (Signed)
Medication Instructions:  Your physician recommends that you continue on your current medications as directed. Please refer to the Current Medication list given to you today.  *If you need a refill on your cardiac medications before your next appointment, please call your pharmacy*  Follow-Up: At [facility], you and your health needs are our priority.  As part of our continuing mission to provide you with exceptional heart care, we have created designated Provider Care Teams.  These Care Teams include your primary Cardiologist (physician) and Advanced Practice Providers (APPs -  Physician Assistants and Nurse Practitioners) who all work together to provide you with the care you need, when you need it.  Your next appointment:   1 year(s)  The format for your next appointment:   In Person  Provider:   You may see Vickie Epley, MD or one of the following Advanced Practice Providers on your designated Care Team:   Tommye Standard, [place] Legrand Como "Jonni Sanger" Chalmers Cater, [place]     Important Information About Sugar

## 2022-11-19 NOTE — Addendum Note (Signed)
Addended by: Drue Novel I on: 11/19/2022 04:22 PM   Modules accepted: Orders

## 2022-11-22 ENCOUNTER — Encounter: Payer: Self-pay | Admitting: Internal Medicine

## 2022-11-22 DIAGNOSIS — I77810 Thoracic aortic ectasia: Secondary | ICD-10-CM | POA: Insufficient documentation

## 2022-11-22 NOTE — Patient Instructions (Signed)
      Blood work was ordered.   The lab is on the first floor.    Medications changes include :       A referral was ordered for XXX.     Someone will call you to schedule an appointment.    No follow-ups on file.  

## 2022-11-22 NOTE — Progress Notes (Unsigned)
      Subjective:    Patient ID: Richard [name], male    DOB: Sep 23, 1986, 36 y.o.   MRN: [id number]     HPI Richard [name] is here for follow up of his chronic medical problems, including htn  He is taking all of his medication as prescribed.    Medications and allergies reviewed with patient and updated if appropriate.  Current Outpatient Medications on File Prior to Visit  Medication Sig Dispense Refill   amLODipine (NORVASC) 5 MG tablet Take 1 tablet (5 mg total) by mouth daily. 90 tablet 3   Diclofenac Sodium 2 % SOLN Apply topically.     fluticasone (FLONASE) 50 MCG/ACT nasal spray Place 1 spray into both nostrils daily. 16 g 0   tadalafil (CIALIS) 20 MG tablet TAKE 1 TABLET BY MOUTH DAILY AS NEEDED 30 tablet 5   No current facility-administered medications on file prior to visit.     Review of Systems  Constitutional:  Negative for fever.  Respiratory:  Negative for cough, shortness of breath and wheezing.   Cardiovascular:  Positive for chest pain. Negative for palpitations and leg swelling.  Neurological:  Negative for light-headedness and headaches.       Objective:   Vitals:   11/25/22 0823  BP: 130/80  Pulse: 79  Temp: 98 F (36.7 C)  SpO2: 98%   BP Readings from Last 3 Encounters:  11/25/22 130/80  11/16/22 124/82  11/06/22 120/82   Wt Readings from Last 3 Encounters:  11/25/22 230 lb (104.3 kg)  11/16/22 231 lb 3.2 oz (104.9 kg)  11/06/22 231 lb (104.8 kg)   Body mass index is 32.08 kg/m.    Physical Exam Constitutional:      General: He is not in acute distress.    Appearance: Normal appearance. He is not ill-appearing.  HENT:     Head: Normocephalic and atraumatic.  Eyes:     Conjunctiva/sclera: Conjunctivae normal.  Cardiovascular:     Rate and Rhythm: Normal rate and regular rhythm.     Heart sounds: Normal heart sounds. No murmur heard. Pulmonary:     Effort: Pulmonary effort is normal. No respiratory distress.     Breath sounds:  Normal breath sounds. No wheezing or rales.  Musculoskeletal:     Right lower leg: No edema.     Left lower leg: No edema.  Skin:    General: Skin is warm and dry.     Findings: No rash.  Neurological:     Mental Status: He is alert. Mental status is at baseline.  Psychiatric:        Mood and Affect: Mood normal.        Lab Results  Component Value Date   WBC 4.0 05/26/2022   HGB 14.3 05/26/2022   HCT 42.3 05/26/2022   PLT 203.0 05/26/2022   GLUCOSE 99 10/21/2022   CHOL 203 (H) 05/26/2022   TRIG 111.0 05/26/2022   HDL 48.90 05/26/2022   LDLCALC 132 (H) 05/26/2022   ALT 17 05/26/2022   AST 17 05/26/2022   NA 140 10/21/2022   K 4.1 10/21/2022   CL 98 10/21/2022   CREATININE 1.41 (H) 10/21/2022   BUN 14 10/21/2022   CO2 27 10/21/2022   TSH 1.79 05/26/2022   PSA 0.49 11/12/2021   HGBA1C 5.2 05/26/2022     Assessment & Plan:    See Problem List for Assessment and Plan of chronic medical problems.

## 2022-11-25 ENCOUNTER — Ambulatory Visit (INDEPENDENT_AMBULATORY_CARE_PROVIDER_SITE_OTHER): Payer: No Typology Code available for payment source | Admitting: Internal Medicine

## 2022-11-25 VITALS — BP 130/80 | HR 79 | Temp 98.0°F | Ht 71.0 in | Wt 230.0 lb

## 2022-11-25 DIAGNOSIS — R739 Hyperglycemia, unspecified: Secondary | ICD-10-CM

## 2022-11-25 DIAGNOSIS — R0789 Other chest pain: Secondary | ICD-10-CM

## 2022-11-25 DIAGNOSIS — I10 Essential (primary) hypertension: Secondary | ICD-10-CM | POA: Diagnosis not present

## 2022-11-25 DIAGNOSIS — N529 Male erectile dysfunction, unspecified: Secondary | ICD-10-CM

## 2022-11-25 NOTE — Assessment & Plan Note (Addendum)
Chronic Blood pressure well controlled Monitoring BP at home - mid 120's/70's-80 CMP Continue amlodipine 5 mg daily

## 2022-11-25 NOTE — Assessment & Plan Note (Signed)
Chronic A1c has been in the normal range Stressed regular exercise, healthy diet, weight loss

## 2022-11-25 NOTE — Assessment & Plan Note (Signed)
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

## 2022-11-25 NOTE — Assessment & Plan Note (Signed)
Chronic Continue as needed Cialis 10-20 mg daily

## 2022-12-22 ENCOUNTER — Other Ambulatory Visit (HOSPITAL_COMMUNITY): Payer: Self-pay

## 2022-12-22 ENCOUNTER — Telehealth: Payer: 59 | Admitting: Physician Assistant

## 2022-12-22 DIAGNOSIS — H1031 Unspecified acute conjunctivitis, right eye: Secondary | ICD-10-CM | POA: Diagnosis not present

## 2022-12-22 MED ORDER — POLYMYXIN B-TRIMETHOPRIM 10000-0.1 UNIT/ML-% OP SOLN
1.0000 [drp] | Freq: Four times a day (QID) | OPHTHALMIC | 0 refills | Status: AC
  Filled 2022-12-22: qty 10, 5d supply, fill #0

## 2022-12-22 NOTE — Progress Notes (Signed)
I have spent 5 minutes in review of e-visit questionnaire, review and updating patient chart, medical decision making and response to patient.   Remington Cody Rosine Solecki, PA-C    

## 2022-12-22 NOTE — Progress Notes (Signed)

## 2023-02-12 DIAGNOSIS — C641 Malignant neoplasm of right kidney, except renal pelvis: Secondary | ICD-10-CM | POA: Diagnosis not present

## 2023-02-15 DIAGNOSIS — N289 Disorder of kidney and ureter, unspecified: Secondary | ICD-10-CM | POA: Diagnosis not present

## 2023-02-15 DIAGNOSIS — R918 Other nonspecific abnormal finding of lung field: Secondary | ICD-10-CM | POA: Diagnosis not present

## 2023-02-15 DIAGNOSIS — C641 Malignant neoplasm of right kidney, except renal pelvis: Secondary | ICD-10-CM | POA: Diagnosis not present

## 2023-02-18 DIAGNOSIS — C641 Malignant neoplasm of right kidney, except renal pelvis: Secondary | ICD-10-CM | POA: Diagnosis not present

## 2023-02-18 DIAGNOSIS — F5221 Male erectile disorder: Secondary | ICD-10-CM | POA: Diagnosis not present

## 2023-02-18 DIAGNOSIS — Z905 Acquired absence of kidney: Secondary | ICD-10-CM | POA: Diagnosis not present

## 2023-02-19 ENCOUNTER — Other Ambulatory Visit (HOSPITAL_BASED_OUTPATIENT_CLINIC_OR_DEPARTMENT_OTHER): Payer: Self-pay

## 2023-02-24 ENCOUNTER — Other Ambulatory Visit (HOSPITAL_BASED_OUTPATIENT_CLINIC_OR_DEPARTMENT_OTHER): Payer: Self-pay

## 2023-02-24 ENCOUNTER — Other Ambulatory Visit: Payer: Self-pay

## 2023-03-17 NOTE — Progress Notes (Unsigned)
[name] [address] [place] [address] Phone: [phone] Subjective:   Richard [name], am serving as a scribe for Dr. Antoine Primas.  I'm seeing this patient by the request  of:  Pincus Sanes, MD  CC: left knee pain   [id number]  11/06/2022 No reaccumulation of the Baker's cyst. Patient does not have any locking or giving out on him and I do think the patient can do relatively well with conservative therapy at this time. Discussed icing regimen and home exercises. Discussed still being aware of full flexion extension and twisting motions. Follow-up with me again in 6 to 8 weeks.   Update 03/18/2023 Richard [name] is a 37 y.o. male coming in with complaint of L knee pain. Patient states last week has been bothering him again. Stiff, tight and sore until he moves around. Still has some pain with turning when foot is planted. Joined [employer] working out and wears a brace and has started tennis more competitively again and thinks with all of that he has aggravated it and wants to talk about how to manage that pain       Past Medical History:  Diagnosis Date   HTN (hypertension)    Renal cell carcinoma (HCC)    Stress fracture, right ankle, sequela    Subluxation of extensor carpi ulnaris tendon, right, sequela    Past Surgical History:  Procedure Laterality Date   ROBOT ASSISTED LAPAROSCOPIC NEPHRECTOMY Right 01/30/2022   Procedure: XI ROBOTIC ASSISTED LAPAROSCOPIC NEPHRECTOMY;  Surgeon: Sebastian Ache, MD;  Location: [facility];  Service: Urology;  Laterality: Right;   TONSILLECTOMY AND ADENOIDECTOMY  1996   WISDOM TOOTH EXTRACTION     Social History   Socioeconomic History   Marital status: Married    Spouse name: Not on file   Number of children: 0   Years of education: Not on file   Highest education level: Master's degree (e.g., MA, MS, MEng, MEd, MSW, MBA)  Occupational History   Occupation: Marketing  Tobacco Use    Smoking status: Never   Smokeless tobacco: Never  Vaping Use   Vaping Use: Never used  Substance and Sexual Activity   Alcohol use: Yes    Comment: social on weekend   Drug use: Never   Sexual activity: Yes    Partners: Female  Other Topics Concern   Not on file  Social History Narrative   Not on file   Social Determinants of Health   Financial Resource Strain: Low Risk  (11/12/2021)   Overall Financial Resource Strain (CARDIA)    Difficulty of Paying Living Expenses: Not hard at all  Food Insecurity: No Food Insecurity (11/12/2021)   Hunger Vital Sign    Worried About Running Out of Food in the Last Year: Never true    Ran Out of Food in the Last Year: Never true  Transportation Needs: No Transportation Needs (11/12/2021)   PRAPARE - Administrator, Civil Service (Medical): No    Lack of Transportation (Non-Medical): No  Physical Activity: Sufficiently Active (11/12/2021)   Exercise Vital Sign    Days of Exercise per Week: 5 days    Minutes of Exercise per Session: 30 min  Stress: No Stress Concern Present (11/12/2021)   [employer] of Occupational Health - Occupational Stress Questionnaire    Feeling of Stress : Only a little  Social Connections: Unknown (11/12/2021)   Social Connection and Isolation Panel [NHANES]    Frequency  of Communication with Friends and Family: More than three times a week    Frequency of Social Gatherings with Friends and Family: Twice a week    Attends Religious Services: Patient declined    Database administrator or Organizations: No    Attends Engineer, structural: Not on file    Marital Status: Married   Allergies  Allergen Reactions   Prednisone Palpitations    Racing heart   Family History  Problem Relation Age of Onset   Hyperlipidemia Mother    Hypertension Mother    Hypertension Father    Arthritis-Osteo Father    Hearing loss Father    High blood pressure Father    High Cholesterol Father    Glaucoma  Father    Prostate cancer Father 87   Asthma Sister    Glaucoma Sister    Polycystic ovary syndrome Sister    Hearing loss Maternal Grandmother    Heart disease Maternal Grandmother 72   High blood pressure Maternal Grandmother    High Cholesterol Maternal Grandmother    Stroke Maternal Grandmother 80       TIAs   Heart attack Maternal Grandfather 54   High Cholesterol Maternal Grandfather    Cancer Paternal Grandmother        possibly had lung cancer, smoked   Hearing loss Paternal Grandfather    Heart Problems Maternal Uncle        ?left ventricular issues, ICD   High Cholesterol Maternal Uncle    Bladder Cancer Paternal Uncle    Prostate cancer Paternal Uncle    Bladder Cancer Paternal Uncle        dx. late 72s   Pancreatic cancer Maternal Great-grandfather 58   Stomach cancer Other    Pancreatic cancer Other      Current Outpatient Medications (Cardiovascular):    amLODipine (NORVASC) 5 MG tablet, Take 1 tablet (5 mg total) by mouth daily.   tadalafil (CIALIS) 20 MG tablet, TAKE 1 TABLET BY MOUTH DAILY AS NEEDED  Current Outpatient Medications (Respiratory):    fluticasone (FLONASE) 50 MCG/ACT nasal spray, Place 1 spray into both nostrils daily.    Current Outpatient Medications (Other):    Diclofenac Sodium 2 % SOLN, Apply topically.   Reviewed prior external information including notes and imaging from  primary care provider As well as notes that were available from care everywhere and other healthcare systems.  Past medical history, social, surgical and family history all reviewed in electronic medical record.  No pertanent information unless stated regarding to the chief complaint.   Review of Systems:  No headache, visual changes, nausea, vomiting, diarrhea, constipation, dizziness, abdominal pain, skin rash, fevers, chills, night sweats, weight loss, swollen lymph nodes, body aches, joint swelling, chest pain, shortness of breath, mood changes. POSITIVE  muscle aches  Objective  Blood pressure 130/84, pulse 63, height 5\' 11"  (1.803 m), weight 242 lb (109.8 kg), SpO2 93 %.   General: No apparent distress alert and oriented x3 mood and affect normal, dressed appropriately.  HEENT: Pupils equal, extraocular movements intact  Respiratory: Patient's speak in full sentences and does not appear short of breath  Cardiovascular: No lower extremity edema, non tender, no erythema  Knee exam shows left knee has a trace effusion noted of the patellofemoral joint.  Fullness noted in the posterior aspect of the knee.  Limited muscular skeletal ultrasound was performed and interpreted by Antoine Primas, M  Left knee:shows hypoechoic changes in the posterior portion.  Patient  does have what appears to be a Baker's cyst noted again.  Seems to have reaccumulation.  Popliteal tendon unremarkable, meniscus appear to be unremarkable.  Narrowing of the patellofemoral joint still noted. Impression: Reaccumulation of Baker's cyst  Procedure: Real-time Ultrasound Guided Injection of left knee Baker's cyst Device: GE Logiq Q7 Ultrasound guided injection is preferred based studies that show increased duration, increased effect, greater accuracy, decreased procedural pain, increased response rate, and decreased cost with ultrasound guided versus blind injection.  Verbal informed consent obtained.  Time-out conducted.  Noted no overlying erythema, induration, or other signs of local infection.  Skin prepped in a sterile fashion.  Local anesthesia: Topical Ethyl chloride.  With sterile technique and under real time ultrasound guidance: With a 21-gauge 2 inch needle injected with 1 cc of 0.5% Marcaine and aspirated 32 cc of straw-colored fluid.  Injected and 1 cc of Kenalog 40 mg/mL Completed without difficulty  Pain immediately resolved suggesting accurate placement of the medication.  Advised to call if fevers/chills, erythema, induration, drainage, or persistent  bleeding.  Impression: Technically successful ultrasound guided injection.    Impression and Recommendations:     The above documentation has been reviewed and is accurate and complete Judi SaaZachary M Marnie Fazzino, DO

## 2023-03-18 ENCOUNTER — Encounter: Payer: Self-pay | Admitting: Family Medicine

## 2023-03-18 ENCOUNTER — Ambulatory Visit: Payer: 59 | Admitting: Family Medicine

## 2023-03-18 VITALS — BP 130/84 | HR 63 | Ht 71.0 in | Wt 242.0 lb

## 2023-03-18 DIAGNOSIS — M7122 Synovial cyst of popliteal space [Baker], left knee: Secondary | ICD-10-CM

## 2023-03-18 NOTE — Assessment & Plan Note (Signed)
Repeat aspiration and then a, do not see any significant worsening at all.  Discussed icing regimen and home exercises.  Discussed compression.  Follow-up again 6 to 8 weeks

## 2023-03-18 NOTE — Patient Instructions (Addendum)
Good to see you as always Have a great time in [email or web address] Have an appointment in 6 to 8 weeks just in case

## 2023-04-12 ENCOUNTER — Ambulatory Visit: Payer: No Typology Code available for payment source | Admitting: Dermatology

## 2023-05-19 NOTE — Progress Notes (Signed)
Richard [name] [address] [place] [address] Phone: [phone] Subjective:   Richard [name], am serving as a scribe for Dr. Antoine [name].  I'm seeing this patient by the request  of:  Richard Sanes, MD  CC:   [id number]  03/18/2023 Repeat aspiration and then a, do not see any significant worsening at all.  Discussed icing regimen and home exercises.  Discussed compression.  Follow-up again 6 to 8 weeks    Update 05/21/2023 Richard [name] is a 37 y.o. male coming in with complaint of L knee pain. Patient states still having pain and discomfort in knee. Plant and turn sharp pain. Flexion is decreased and pain at end point. Feels grinding and clicking when walking. If he does take anything it's Tylenol.     Past Medical History:  Diagnosis Date   HTN (hypertension)    Renal cell carcinoma (HCC)    Stress fracture, right ankle, sequela    Subluxation of extensor carpi ulnaris tendon, right, sequela    Past Surgical History:  Procedure Laterality Date   ROBOT ASSISTED LAPAROSCOPIC NEPHRECTOMY Right 01/30/2022   Procedure: XI ROBOTIC ASSISTED LAPAROSCOPIC NEPHRECTOMY;  Surgeon: Richard Ache, MD;  Location: [facility];  Service: Urology;  Laterality: Right;   TONSILLECTOMY AND ADENOIDECTOMY  1996   WISDOM TOOTH EXTRACTION     Social History   Socioeconomic History   Marital status: Married    Spouse name: Not on file   Number of children: 0   Years of education: Not on file   Highest education level: Master's degree (e.g., MA, MS, MEng, MEd, MSW, MBA)  Occupational History   Occupation: Marketing  Tobacco Use   Smoking status: Never   Smokeless tobacco: Never  Vaping Use   Vaping Use: Never used  Substance and Sexual Activity   Alcohol use: Yes    Comment: social on weekend   Drug use: Never   Sexual activity: Yes    Partners: Female  Other Topics Concern   Not on file  Social History Narrative   Not on file   Social  Determinants of Health   Financial Resource Strain: Low Risk  (11/12/2021)   Overall Financial Resource Strain (CARDIA)    Difficulty of Paying Living Expenses: Not hard at all  Food Insecurity: No Food Insecurity (11/12/2021)   Hunger Vital Sign    Worried About Running Out of Food in the Last Year: Never true    Ran Out of Food in the Last Year: Never true  Transportation Needs: No Transportation Needs (11/12/2021)   PRAPARE - Administrator, Civil Service (Medical): No    Lack of Transportation (Non-Medical): No  Physical Activity: Sufficiently Active (11/12/2021)   Exercise Vital Sign    Days of Exercise per Week: 5 days    Minutes of Exercise per Session: 30 min  Stress: No Stress Concern Present (11/12/2021)   [employer] of Occupational Health - Occupational Stress Questionnaire    Feeling of Stress : Only a little  Social Connections: Unknown (11/12/2021)   Social Connection and Isolation Panel [NHANES]    Frequency of Communication with Friends and Family: More than three times a week    Frequency of Social Gatherings with Friends and Family: Twice a week    Attends Religious Services: Patient declined    Database administrator or Organizations: No    Attends Engineer, structural: Not on file    Marital Status:  Married   Allergies  Allergen Reactions   Prednisone Palpitations    Racing heart   Family History  Problem Relation Age of Onset   Hyperlipidemia Mother    Hypertension Mother    Hypertension Father    Arthritis-Osteo Father    Hearing loss Father    High blood pressure Father    High Cholesterol Father    Glaucoma Father    Prostate cancer Father 22   Asthma Sister    Glaucoma Sister    Polycystic ovary syndrome Sister    Hearing loss Maternal Grandmother    Heart disease Maternal Grandmother 38   High blood pressure Maternal Grandmother    High Cholesterol Maternal Grandmother    Stroke Maternal Grandmother 80       TIAs    Heart attack Maternal Grandfather 54   High Cholesterol Maternal Grandfather    Cancer Paternal Grandmother        possibly had lung cancer, smoked   Hearing loss Paternal Grandfather    Heart Problems Maternal Uncle        ?left ventricular issues, ICD   High Cholesterol Maternal Uncle    Bladder Cancer Paternal Uncle    Prostate cancer Paternal Uncle    Bladder Cancer Paternal Uncle        dx. late 40s   Pancreatic cancer Maternal Great-grandfather 39   Stomach cancer Other    Pancreatic cancer Other      Current Outpatient Medications (Cardiovascular):    amLODipine (NORVASC) 5 MG tablet, Take 1 tablet (5 mg total) by mouth daily.   tadalafil (CIALIS) 20 MG tablet, TAKE 1 TABLET BY MOUTH DAILY AS NEEDED  Current Outpatient Medications (Respiratory):    fluticasone (FLONASE) 50 MCG/ACT nasal spray, Place 1 spray into both nostrils daily.    Current Outpatient Medications (Other):    Diclofenac Sodium 2 % SOLN, Apply topically.   Reviewed prior external information including notes and imaging from  primary care provider As well as notes that were available from care everywhere and other healthcare systems.  Past medical history, social, surgical and family history all reviewed in electronic medical record.  No pertanent information unless stated regarding to the chief complaint.   Review of Systems:  No headache, visual changes, nausea, vomiting, diarrhea, constipation, dizziness, abdominal pain, skin rash, fevers, chills, night sweats, weight loss, swollen lymph nodes, body aches, joint swelling, chest pain, shortness of breath, mood changes. POSITIVE muscle aches  Objective  Blood pressure 110/74, pulse 71, height 5\' 11"  (1.803 m), weight 227 lb (103 kg), SpO2 97 %.   General: No apparent distress alert and oriented x3 mood and affect normal, dressed appropriately.  HEENT: Pupils equal, extraocular movements intact  Respiratory: Patient's speak in full sentences and  does not appear short of breath  Cardiovascular: No lower extremity edema, non tender, no erythema  Antalgic gait noted.  Left knee does have an effusion noted again.  Patient does have some crepitus with certain range of motion.  Positive McMurray's noted tender to palpation more over the lateral aspect of the knee.  Limited muscular skeletal ultrasound was performed and interpreted by Richard [name], M  Ultrasound shows that there is a reaccumulation of the Baker's cyst noted with hypoechoic changes in the popliteal area.  The patient does have still some abnormal findings noted of the medial and lateral meniscus.  Questionable worsening narrowing of the patellofemoral joint. Impression: Abnormal findings of the meniscus with Baker's cyst    Impression  and Recommendations:    The above documentation has been reviewed and is accurate and complete Judi Saa, DO

## 2023-05-21 ENCOUNTER — Ambulatory Visit: Payer: 59 | Admitting: Family Medicine

## 2023-05-21 ENCOUNTER — Other Ambulatory Visit: Payer: Self-pay

## 2023-05-21 ENCOUNTER — Encounter: Payer: Self-pay | Admitting: Family Medicine

## 2023-05-21 VITALS — BP 110/74 | HR 71 | Ht 71.0 in | Wt 227.0 lb

## 2023-05-21 DIAGNOSIS — M7122 Synovial cyst of popliteal space [Baker], left knee: Secondary | ICD-10-CM

## 2023-05-21 DIAGNOSIS — G8929 Other chronic pain: Secondary | ICD-10-CM | POA: Diagnosis not present

## 2023-05-21 DIAGNOSIS — M25562 Pain in left knee: Secondary | ICD-10-CM

## 2023-05-21 NOTE — Patient Instructions (Signed)
[facility] [phone] Call Today  When we receive your results we will contact you.  

## 2023-05-21 NOTE — Assessment & Plan Note (Addendum)
Reaccumulation of the Baker's cyst noted.  Unfortunately I do believe that the underlying meniscal tear is likely contributing and potentially worsening.  Starting to give patient more pain and causing more instability of the knee.  Patient has failed formal physical therapy, home exercises, aspiration of the Baker's cyst and now increasing instability and the underlying potentially worsening of degenerative changes do feel that advanced imaging is warranted at this time patient will follow-up after imaging to discuss further treatment options.

## 2023-05-25 ENCOUNTER — Other Ambulatory Visit: Payer: Self-pay | Admitting: Internal Medicine

## 2023-05-25 ENCOUNTER — Other Ambulatory Visit (HOSPITAL_BASED_OUTPATIENT_CLINIC_OR_DEPARTMENT_OTHER): Payer: Self-pay

## 2023-05-25 MED ORDER — AMLODIPINE BESYLATE 5 MG PO TABS
5.0000 mg | ORAL_TABLET | Freq: Every day | ORAL | 0 refills | Status: DC
  Filled 2023-05-25: qty 30, 30d supply, fill #0

## 2023-05-26 ENCOUNTER — Ambulatory Visit
Admission: RE | Admit: 2023-05-26 | Discharge: 2023-05-26 | Disposition: A | Discharge status: Home / Self Care | Payer: 59 | Source: Ambulatory Visit | Attending: Family Medicine | Admitting: Family Medicine

## 2023-05-26 DIAGNOSIS — M7122 Synovial cyst of popliteal space [Baker], left knee: Secondary | ICD-10-CM | POA: Diagnosis not present

## 2023-05-26 DIAGNOSIS — S83282A Other tear of lateral meniscus, current injury, left knee, initial encounter: Secondary | ICD-10-CM | POA: Diagnosis not present

## 2023-05-26 DIAGNOSIS — G8929 Other chronic pain: Secondary | ICD-10-CM

## 2023-05-27 ENCOUNTER — Encounter: Payer: Self-pay | Admitting: Internal Medicine

## 2023-05-27 NOTE — Patient Instructions (Addendum)
Blood work was ordered.   The lab is on the first floor.    Medications changes include :   none      Return in about 1 year (around 05/27/2024) for Physical Exam.    Health Maintenance, Male Adopting a healthy lifestyle and getting preventive care are important in promoting health and wellness. Ask your health care provider about: The right schedule for you to have regular tests and exams. Things you can do on your own to prevent diseases and keep yourself healthy. What should I know about diet, weight, and exercise? Eat a healthy diet  Eat a diet that includes plenty of vegetables, fruits, low-fat dairy products, and lean protein. Do not eat a lot of foods that are high in solid fats, added sugars, or sodium. Maintain a healthy weight Body mass index (BMI) is a measurement that can be used to identify possible weight problems. It estimates body fat based on height and weight. Your health care provider can help determine your BMI and help you achieve or maintain a healthy weight. Get regular exercise Get regular exercise. This is one of the most important things you can do for your health. Most adults should: Exercise for at least 150 minutes each week. The exercise should increase your heart rate and make you sweat (moderate-intensity exercise). Do strengthening exercises at least twice a week. This is in addition to the moderate-intensity exercise. Spend less time sitting. Even light physical activity can be beneficial. Watch cholesterol and blood lipids Have your blood tested for lipids and cholesterol at 38 years of age, then have this test every 5 years. You may need to have your cholesterol levels checked more often if: Your lipid or cholesterol levels are high. You are older than 38 years of age. You are at high risk for heart disease. What should I know about cancer screening? Many types of cancers can be detected early and may often be prevented. Depending on  your health history and family history, you may need to have cancer screening at various ages. This may include screening for: Colorectal cancer. Prostate cancer. Skin cancer. Lung cancer. What should I know about heart disease, diabetes, and high blood pressure? Blood pressure and heart disease High blood pressure causes heart disease and increases the risk of stroke. This is more likely to develop in people who have high blood pressure readings or are overweight. Talk with your health care provider about your target blood pressure readings. Have your blood pressure checked: Every 3-5 years if you are 75-68 years of age. Every year if you are 56 years old or older. If you are between the ages of 7 and 59 and are a current or former smoker, ask your health care provider if you should have a one-time screening for abdominal aortic aneurysm (AAA). Diabetes Have regular diabetes screenings. This checks your fasting blood sugar level. Have the screening done: Once every three years after age 3 if you are at a normal weight and have a low risk for diabetes. More often and at a younger age if you are overweight or have a high risk for diabetes. What should I know about preventing infection? Hepatitis B If you have a higher risk for hepatitis B, you should be screened for this virus. Talk with your health care provider to find out if you are at risk for hepatitis B infection. Hepatitis C Blood testing is recommended for: Everyone born from [birth year] through 1966. Anyone with  Blood work was ordered.   The lab is on the first floor.    Medications changes include :   none       Return in about 6 months (around 11/05/2023) for follow up.  

## 2023-05-27 NOTE — Progress Notes (Signed)
Subjective:    Patient ID: Richard [name], male    DOB: 01-Sep-1986, 37 y.o.   MRN: [id number]     HPI HAIK is here for a physical exam and his chronic medical problems.   Overall doing well.    Medications and allergies reviewed with patient and updated if appropriate.  Current Outpatient Medications on File Prior to Visit  Medication Sig Dispense Refill   amLODipine (NORVASC) 5 MG tablet Take 1 tablet (5 mg total) by mouth daily. Pls keep 05/28/23 ap;pt for future refills 30 tablet 0   Diclofenac Sodium 2 % SOLN Apply topically.     fluticasone (FLONASE) 50 MCG/ACT nasal spray Place 1 spray into both nostrils daily. 16 g 0   tadalafil (CIALIS) 20 MG tablet TAKE 1 TABLET BY MOUTH DAILY AS NEEDED 30 tablet 5   No current facility-administered medications on file prior to visit.    Review of Systems  Constitutional:  Negative for fever.  Eyes:  Negative for visual disturbance.  Respiratory:  Negative for cough, shortness of breath and wheezing.   Cardiovascular:  Positive for palpitations (rare). Negative for chest pain and leg swelling.  Gastrointestinal:  Negative for abdominal pain, blood in stool, constipation and diarrhea.       Rare gerd  Genitourinary:  Negative for dysuria and hematuria.  Musculoskeletal:  Positive for arthralgias (left knee). Negative for back pain.  Skin:  Negative for rash.  Neurological:  Negative for light-headedness and headaches.  Psychiatric/Behavioral:  Negative for dysphoric mood. The patient is not nervous/anxious.        Objective:   Vitals:   05/28/23 0750  BP: 124/80  Pulse: 72  Temp: 98.5 F (36.9 C)  SpO2: 98%   Filed Weights   05/28/23 0750  Weight: 225 lb 12.8 oz (102.4 kg)   Body mass index is 31.49 kg/m.  BP Readings from Last 3 Encounters:  05/28/23 124/80  05/21/23 110/74  03/18/23 130/84    Wt Readings from Last 3 Encounters:  05/28/23 225 lb 12.8 oz (102.4 kg)  05/21/23 227 lb (103 kg)  03/18/23  242 lb (109.8 kg)      Physical Exam Constitutional: He appears well-developed and well-nourished. No distress.  HENT:  Head: Normocephalic and atraumatic.  Right Ear: External ear normal.  Left Ear: External ear normal.  Normal ear canals and TM b/l  Mouth/Throat: Oropharynx is clear and moist. Eyes: Conjunctivae and EOM are normal.  Neck: Neck supple. No tracheal deviation present. No thyromegaly present.  No carotid bruit  Cardiovascular: Normal rate, regular rhythm, normal heart sounds and intact distal pulses.   No murmur heard.  No lower extremity edema. Pulmonary/Chest: Effort normal and breath sounds normal. No respiratory distress. He has no wheezes. He has no rales.  Abdominal: Soft. He exhibits no distension. There is no tenderness.  Genitourinary: deferred  Lymphadenopathy:   He has no cervical adenopathy.  Skin: Skin is warm and dry. He is not diaphoretic.  Psychiatric: He has a normal mood and affect. His behavior is normal.         Assessment & Plan:   Physical exam: Screening blood work  ordered Exercise   regular - going to [facility] Weight-obese-has been working on weight loss and has lost some weight-encouraged weight loss Substance abuse   none   Reviewed recommended immunizations.   Health Maintenance  Topic Date Due   COVID-19 Vaccine (6 - 2023-24 season) 06/12/2023 (Originally 12/04/2022)   INFLUENZA VACCINE  07/08/2023   DTaP/Tdap/Td (2 - Td or Tdap) 05/20/2030   Hepatitis C Screening  Completed   HIV Screening  Completed   HPV VACCINES  Aged Out     See Problem List for Assessment and Plan of chronic medical problems.

## 2023-05-28 ENCOUNTER — Ambulatory Visit (INDEPENDENT_AMBULATORY_CARE_PROVIDER_SITE_OTHER): Payer: 59 | Admitting: Internal Medicine

## 2023-05-28 ENCOUNTER — Other Ambulatory Visit: Payer: Self-pay

## 2023-05-28 ENCOUNTER — Other Ambulatory Visit (HOSPITAL_BASED_OUTPATIENT_CLINIC_OR_DEPARTMENT_OTHER): Payer: Self-pay

## 2023-05-28 VITALS — BP 124/80 | HR 72 | Temp 98.5°F | Ht 71.0 in | Wt 225.8 lb

## 2023-05-28 DIAGNOSIS — R739 Hyperglycemia, unspecified: Secondary | ICD-10-CM | POA: Diagnosis not present

## 2023-05-28 DIAGNOSIS — N529 Male erectile dysfunction, unspecified: Secondary | ICD-10-CM | POA: Diagnosis not present

## 2023-05-28 DIAGNOSIS — I77810 Thoracic aortic ectasia: Secondary | ICD-10-CM

## 2023-05-28 DIAGNOSIS — Z0000 Encounter for general adult medical examination without abnormal findings: Secondary | ICD-10-CM

## 2023-05-28 DIAGNOSIS — I10 Essential (primary) hypertension: Secondary | ICD-10-CM | POA: Diagnosis not present

## 2023-05-28 DIAGNOSIS — Z13220 Encounter for screening for lipoid disorders: Secondary | ICD-10-CM | POA: Diagnosis not present

## 2023-05-28 LAB — CBC WITH DIFFERENTIAL/PLATELET
Basophils Absolute: 0 10*3/uL (ref 0.0–0.1)
Basophils Relative: 0.7 % (ref 0.0–3.0)
Eosinophils Absolute: 0.1 10*3/uL (ref 0.0–0.7)
Eosinophils Relative: 1.7 % (ref 0.0–5.0)
HCT: 41.7 % (ref 39.0–52.0)
Hemoglobin: 14.3 g/dL (ref 13.0–17.0)
Lymphocytes Relative: 28.2 % (ref 12.0–46.0)
Lymphs Abs: 1.2 10*3/uL (ref 0.7–4.0)
MCHC: 34.4 g/dL (ref 30.0–36.0)
MCV: 87.8 fl (ref 78.0–100.0)
Monocytes Absolute: 0.3 10*3/uL (ref 0.1–1.0)
Monocytes Relative: 7.9 % (ref 3.0–12.0)
Neutro Abs: 2.7 10*3/uL (ref 1.4–7.7)
Neutrophils Relative %: 61.5 % (ref 43.0–77.0)
Platelets: 202 10*3/uL (ref 150.0–400.0)
RBC: 4.75 Mil/uL (ref 4.22–5.81)
RDW: 13.5 % (ref 11.5–15.5)
WBC: 4.4 10*3/uL (ref 4.0–10.5)

## 2023-05-28 LAB — LIPID PANEL
Cholesterol: 160 mg/dL (ref 0–200)
HDL: 38.7 mg/dL — ABNORMAL LOW (ref >39.00)
LDL Cholesterol: 95 mg/dL (ref 0–99)
NonHDL: 121.44
Total CHOL/HDL Ratio: 4
Triglycerides: 133 mg/dL (ref 0.0–149.0)
VLDL: 26.6 mg/dL (ref 0.0–40.0)

## 2023-05-28 LAB — COMPREHENSIVE METABOLIC PANEL
ALT: 19 U/L (ref 0–53)
AST: 17 U/L (ref 0–37)
Albumin: 4.6 g/dL (ref 3.5–5.2)
Alkaline Phosphatase: 71 U/L (ref 39–117)
BUN: 17 mg/dL (ref 6–23)
CO2: 30 mEq/L (ref 19–32)
Calcium: 9.5 mg/dL (ref 8.4–10.5)
Chloride: 101 mEq/L (ref 96–112)
Creatinine, Ser: 1.39 mg/dL (ref 0.40–1.50)
GFR: 64.41 mL/min (ref >60.00)
Glucose, Bld: 93 mg/dL (ref 70–99)
Potassium: 4.1 mEq/L (ref 3.5–5.1)
Sodium: 140 mEq/L (ref 135–145)
Total Bilirubin: 1 mg/dL (ref 0.2–1.2)
Total Protein: 7.2 g/dL (ref 6.0–8.3)

## 2023-05-28 LAB — TSH: TSH: 1.66 u[IU]/mL (ref 0.35–5.50)

## 2023-05-28 MED ORDER — TADALAFIL 20 MG PO TABS
20.0000 mg | ORAL_TABLET | Freq: Every day | ORAL | 5 refills | Status: DC | PRN
  Filled 2023-05-28: qty 30, 30d supply, fill #0

## 2023-05-28 MED ORDER — AMLODIPINE BESYLATE 5 MG PO TABS
5.0000 mg | ORAL_TABLET | Freq: Every day | ORAL | 3 refills | Status: DC
  Filled 2023-05-28 – 2023-06-19 (×2): qty 90, 90d supply, fill #0
  Filled 2023-09-16: qty 90, 90d supply, fill #1
  Filled 2023-12-15: qty 90, 90d supply, fill #2
  Filled 2024-03-17: qty 90, 90d supply, fill #3

## 2023-05-28 NOTE — Assessment & Plan Note (Signed)
Chronic Cardiology will monitor annually

## 2023-05-28 NOTE — Assessment & Plan Note (Signed)
Chronic A1c has been in the normal range Stressed regular exercise, healthy diet, weight loss 

## 2023-05-28 NOTE — Assessment & Plan Note (Signed)
Chronic Continue as needed Cialis 10-20 mg daily 

## 2023-05-28 NOTE — Assessment & Plan Note (Addendum)
Chronic Blood pressure well controlled CMP, CBC, lipid, TSH Continue amlodipine 5 mg daily

## 2023-05-31 ENCOUNTER — Encounter: Payer: Self-pay | Admitting: Family Medicine

## 2023-06-02 ENCOUNTER — Other Ambulatory Visit: Payer: Self-pay

## 2023-06-02 DIAGNOSIS — M1712 Unilateral primary osteoarthritis, left knee: Secondary | ICD-10-CM

## 2023-06-14 DIAGNOSIS — M25562 Pain in left knee: Secondary | ICD-10-CM | POA: Diagnosis not present

## 2023-06-16 ENCOUNTER — Other Ambulatory Visit: Payer: Self-pay | Admitting: Orthopaedic Surgery

## 2023-06-19 ENCOUNTER — Other Ambulatory Visit (HOSPITAL_BASED_OUTPATIENT_CLINIC_OR_DEPARTMENT_OTHER): Payer: Self-pay

## 2023-06-28 ENCOUNTER — Telehealth: Payer: 59 | Admitting: Nurse Practitioner

## 2023-06-28 ENCOUNTER — Other Ambulatory Visit (HOSPITAL_BASED_OUTPATIENT_CLINIC_OR_DEPARTMENT_OTHER): Payer: Self-pay

## 2023-06-28 DIAGNOSIS — L249 Irritant contact dermatitis, unspecified cause: Secondary | ICD-10-CM

## 2023-06-28 MED ORDER — TRIAMCINOLONE ACETONIDE 0.1 % EX CREA
1.0000 | TOPICAL_CREAM | Freq: Two times a day (BID) | CUTANEOUS | 0 refills | Status: DC
  Filled 2023-06-28: qty 30, 15d supply, fill #0

## 2023-06-28 NOTE — Progress Notes (Signed)
E Visit for Rash  We are sorry that you are not feeling well. Here is how we plan to help!  Based on what you shared with me it looks like you have contact dermatitis.  Contact dermatitis is a skin rash caused by something that touches the skin and causes irritation or inflammation.  Your skin may be red, swollen, dry, cracked, and itch.  The rash should go away in a few days but can last a few weeks.  If you get a rash, it's important to figure out what caused it so the irritant can be avoided in the future.  We are going to prescribe a stronger topical steroid for relief: Meds ordered this encounter  Medications   triamcinolone cream (KENALOG) 0.1 %    Sig: Apply 1 Application topically 2 (two) times daily.    Dispense:  30 g    Refill:  0     HOME CARE:  Take cool showers and avoid direct sunlight. Apply cool compress or wet dressings. Take a bath in an oatmeal bath.  Sprinkle content of one Aveeno packet under running faucet with comfortably warm water.  Bathe for 15-20 minutes, 1-2 times daily.  Pat dry with a towel. Do not rub the rash. Use hydrocortisone cream. Take an antihistamine like Benadryl for widespread rashes that itch.  The adult dose of Benadryl is 25-50 mg by mouth 4 times daily. Caution:  This type of medication may cause sleepiness.  Do not drink alcohol, drive, or operate dangerous machinery while taking antihistamines.  Do not take these medications if you have prostate enlargement.  Read package instructions thoroughly on all medications that you take.  GET HELP RIGHT AWAY IF:  Symptoms don't go away after treatment. Severe itching that persists. If you rash spreads or swells. If you rash begins to smell. If it blisters and opens or develops a yellow-brown crust. You develop a fever. You have a sore throat. You become short of breath.  MAKE SURE YOU:  Understand these instructions. Will watch your condition. Will get help right away if you are not doing  well or get worse.  Thank you for choosing an e-visit.  Your e-visit answers were reviewed by a board certified advanced clinical practitioner to complete your personal care plan. Depending upon the condition, your plan could have included both over the counter or prescription medications.  Please review your pharmacy choice. Make sure the pharmacy is open so you can pick up prescription now. If there is a problem, you may contact your provider through [employer] and have the prescription routed to another pharmacy.  Your safety is important to [place]. If you have drug allergies check your prescription carefully.   For the next 24 hours you can use MyChart to ask questions about today's visit, request a non-urgent call back, or ask for a work or school excuse. You will get an email in the next two days asking about your experience. I hope that your e-visit has been valuable and will speed your recovery.   Meds ordered this encounter  Medications   triamcinolone cream (KENALOG) 0.1 %    Sig: Apply 1 Application topically 2 (two) times daily.    Dispense:  30 g    Refill:  0    I spent approximately 5 minutes reviewing the patient's history, current symptoms and coordinating their care today.

## 2023-06-29 ENCOUNTER — Other Ambulatory Visit (HOSPITAL_BASED_OUTPATIENT_CLINIC_OR_DEPARTMENT_OTHER): Payer: Self-pay

## 2023-06-29 ENCOUNTER — Other Ambulatory Visit: Payer: Self-pay | Admitting: Orthopaedic Surgery

## 2023-07-12 ENCOUNTER — Other Ambulatory Visit (HOSPITAL_BASED_OUTPATIENT_CLINIC_OR_DEPARTMENT_OTHER): Payer: Self-pay

## 2023-07-13 ENCOUNTER — Ambulatory Visit: Admit: 2023-07-13 | Payer: 59 | Admitting: Orthopaedic Surgery

## 2023-07-13 SURGERY — ARTHROSCOPY, KNEE
Anesthesia: Choice | Site: Knee | Laterality: Left

## 2023-07-21 ENCOUNTER — Encounter: Payer: Self-pay | Admitting: Dermatology

## 2023-07-21 ENCOUNTER — Ambulatory Visit: Payer: 59 | Admitting: Dermatology

## 2023-07-21 DIAGNOSIS — Z808 Family history of malignant neoplasm of other organs or systems: Secondary | ICD-10-CM | POA: Diagnosis not present

## 2023-07-21 DIAGNOSIS — D229 Melanocytic nevi, unspecified: Secondary | ICD-10-CM

## 2023-07-21 DIAGNOSIS — L905 Scar conditions and fibrosis of skin: Secondary | ICD-10-CM

## 2023-07-21 DIAGNOSIS — D489 Neoplasm of uncertain behavior, unspecified: Secondary | ICD-10-CM

## 2023-07-21 DIAGNOSIS — D1801 Hemangioma of skin and subcutaneous tissue: Secondary | ICD-10-CM

## 2023-07-21 DIAGNOSIS — W908XXA Exposure to other nonionizing radiation, initial encounter: Secondary | ICD-10-CM

## 2023-07-21 DIAGNOSIS — D2261 Melanocytic nevi of right upper limb, including shoulder: Secondary | ICD-10-CM | POA: Diagnosis not present

## 2023-07-21 DIAGNOSIS — L578 Other skin changes due to chronic exposure to nonionizing radiation: Secondary | ICD-10-CM

## 2023-07-21 DIAGNOSIS — L821 Other seborrheic keratosis: Secondary | ICD-10-CM

## 2023-07-21 DIAGNOSIS — Z1283 Encounter for screening for malignant neoplasm of skin: Secondary | ICD-10-CM | POA: Diagnosis not present

## 2023-07-21 DIAGNOSIS — L814 Other melanin hyperpigmentation: Secondary | ICD-10-CM | POA: Diagnosis not present

## 2023-07-21 NOTE — Patient Instructions (Addendum)
Hello [name],  Thank you for visiting [place] today. We appreciate your proactive approach to monitoring your skin health, especially considering your family history of skin cancer. Here is a summary of the key instructions and recommendations from today's consultation:  - Sun Protection: Continue using sunscreen and wearing long-sleeve sun shirts as you have been doing.  - Mole Monitoring: Keep a close eye on your moles, especially since you are prone to developing more. Any changes should prompt an earlier visit to our office.  - Biopsy of Moles: We took biopsies of two suspicious moles located on your right upper paraspinal and right posterior shoulder. Here are the details:   - Mole A: Right upper paraspinal, 6 mm irregular brown-black macule.   - Mole B: Right posterior shoulder, 6.5 mm irregular dark brown macule.  - Post-Biopsy Care: You can shower normally but apply Vaseline and a Band-Aid after drying the area.   - Follow-Up Communication: Results from the biopsies will be communicated through MyChart. You will receive a message if the results are normal, and we will call you if further action is needed.  - Annual Skin Checks: Schedule yearly visits to monitor your skin. If you notice any changes or develop new concerns, please make an appointment to come in sooner.  We look forward to supporting you in maintaining your skin health. Please do not hesitate to reach out if you have any questions or concerns.  Best regards,  Dr. [facility]     Patient Handout: Wound Care for Skin Biopsy Site  Patient Handout: Wound Care for Skin Biopsy Site  Taking Care of Your Skin Biopsy Site  Proper care of the biopsy site is essential for promoting healing and minimizing scarring. This handout provides instructions on how to care for your biopsy site to ensure optimal recovery.  1. Cleaning the Wound:  Clean the biopsy site daily with gentle soap and water. Gently pat the  area dry with a clean, soft towel. Avoid harsh scrubbing or rubbing the area, as this can irritate the skin and delay healing.  2. Applying Aquaphor and Bandage:  After cleaning the wound, apply a thin layer of Aquaphor ointment to the biopsy site. Cover the area with a sterile bandage to protect it from dirt, bacteria, and friction. Change the bandage daily or as needed if it becomes soiled or wet.  3. Continued Care for One Week:  Repeat the cleaning, Aquaphor application, and bandaging process daily for one week following the biopsy procedure. Keeping the wound clean and moist during this initial healing period will help prevent infection and promote optimal healing.  4. Massaging Aquaphor into the Area:  ---After one week, discontinue the use of bandages but continue to apply Aquaphor to the biopsy site. ----Gently massage the Aquaphor into the area using circular motions. ---Massaging the skin helps to promote circulation and prevent the formation of scar tissue.   Additional Tips:  Avoid exposing the biopsy site to direct sunlight during the healing process, as this can cause hyperpigmentation or worsen scarring. If you experience any signs of infection, such as increased redness, swelling, warmth, or drainage from the wound, contact your healthcare provider immediately. Follow any additional instructions provided by your healthcare provider for caring for the biopsy site and managing any discomfort. Conclusion:  Taking proper care of your skin biopsy site is crucial for ensuring optimal healing and minimizing scarring. By following these instructions for cleaning, applying Aquaphor, and massaging the area, you can  promote a smooth and successful recovery. If you have any questions or concerns about caring for your biopsy site, don't hesitate to contact your healthcare provider for guidance.     Important Information  Due to recent changes in healthcare laws, you may see  results of your pathology and/or laboratory studies on MyChart before the doctors have had a chance to review them. We understand that in some cases there may be results that are confusing or concerning to you. Please understand that not all results are received at the same time and often the doctors may need to interpret multiple results in order to provide you with the best plan of care or course of treatment. Therefore, we ask that you please give [place] 2 business days to thoroughly review all your results before contacting the office for clarification. Should we see a critical lab result, you will be contacted sooner.   If You Need Anything After Your Visit  If you have any questions or concerns for your doctor, please call our main line at [phone] If no one answers, please leave a voicemail as directed and we will return your call as soon as possible. Messages left after 4 pm will be answered the following business day.   You may also send [place] a message via MyChart. We typically respond to MyChart messages within 1-2 business days.  For prescription refills, please ask your pharmacy to contact our office. Our fax number is [phone].  If you have an urgent issue when the clinic is closed that cannot wait until the next business day, you can page your doctor at the number below.    Please note that while we do our best to be available for urgent issues outside of office hours, we are not available 24/7.   If you have an urgent issue and are unable to reach [place], you may choose to seek medical care at your doctor's office, retail clinic, urgent care center, or emergency room.  If you have a medical emergency, please immediately call 911 or go to the emergency department. In the event of inclement weather, please call our main line at [phone] for an update on the status of any delays or closures.  Dermatology Medication Tips: Please keep the boxes that topical medications come in in  order to help keep track of the instructions about where and how to use these. Pharmacies typically print the medication instructions only on the boxes and not directly on the medication tubes.   If your medication is too expensive, please contact our office at [phone] or send [place] a message through MyChart.   We are unable to tell what your co-pay for medications will be in advance as this is different depending on your insurance coverage. However, we may be able to find a substitute medication at lower cost or fill out paperwork to get insurance to cover a needed medication.   If a prior authorization is required to get your medication covered by your insurance company, please allow [place] 1-2 business days to complete this process.  Drug prices often vary depending on where the prescription is filled and some pharmacies may offer cheaper prices.  The website www.goodrx.com contains coupons for medications through different pharmacies. The prices here do not account for what the cost may be with help from insurance (it may be cheaper with your insurance), but the website can give you the price if you did not use any insurance.  - You can print the associated  coupon and take it with your prescription to the pharmacy.  - You may also stop by our office during regular business hours and pick up a GoodRx coupon card.  - If you need your prescription sent electronically to a different pharmacy, notify our office through [facility] or by phone at [phone]

## 2023-07-21 NOTE — Progress Notes (Signed)
New Patient Visit   Subjective  Richard [name] is a 37 y.o. male who presents for the following: TBSE The patient has spots, moles and lesions to be evaluated, some may be new or changing and the patient may have concern these could be cancer.  Patient is a previous patient of Dr. Jorja Loa. His last skin check was about a year ago. Richard [name] has had an abnormal mole removed about three years ago but did not result as cancerous. Father has hx of skin cancer. Wears sunscreen, hats, and sun shirts. Patient does not have any specific concerns for today's visit.   The following portions of the chart were reviewed this encounter and updated as appropriate: medications, allergies, medical history  Review of Systems:  No other skin or systemic complaints except as noted in HPI or Assessment and Plan.  Objective  Well appearing patient in no apparent distress; mood and affect are within normal limits.  A full examination was performed including scalp, head, eyes, ears, nose, lips, neck, chest, axillae, abdomen, back, buttocks, bilateral upper extremities, bilateral lower extremities, hands, feet, fingers, toes, fingernails, and toenails. All findings within normal limits unless otherwise noted below.    Relevant exam findings are noted in the Assessment and Plan.  Right Upper Paraostial A: 6mm irregular brown black pap  Right Shoulder - Posterior B: 6.27mm irregular dark brown macule     Assessment & Plan   LENTIGINES, SEBORRHEIC KERATOSES, HEMANGIOMAS - Benign normal skin lesions - Benign-appearing - Call for any changes  MELANOCYTIC NEVI - Tan-brown and/or pink-flesh-colored symmetric macules and papules - Benign appearing on exam today - Observation - Call clinic for new or changing moles - Recommend daily use of broad spectrum spf 30+ sunscreen to sun-exposed areas.   ACTINIC DAMAGE - Chronic condition, secondary to cumulative UV/sun exposure - diffuse scaly erythematous macules with  underlying dyspigmentation - Recommend daily broad spectrum sunscreen SPF 30+ to sun-exposed areas, reapply every 2 hours as needed.  - Staying in the shade or wearing long sleeves, sun glasses (UVA+UVB protection) and wide brim hats (4-inch brim around the entire circumference of the hat) are also recommended for sun protection.  - Call for new or changing lesions.  SKIN CANCER SCREENING PERFORMED TODAY   Neoplasm of uncertain behavior (2) Right Upper Paraostial  Skin / nail biopsy Type of biopsy: tangential   Informed consent: discussed and consent obtained   Timeout: patient name, date of birth, surgical site, and procedure verified   Procedure prep:  Patient was prepped and draped in usual sterile fashion Prep type:  Isopropyl alcohol Anesthesia: the lesion was anesthetized in a standard fashion   Anesthetic:  1% lidocaine w/ epinephrine 1-100,000 buffered w/ 8.4% NaHCO3 Instrument used: DermaBlade   Hemostasis achieved with: aluminum chloride   Outcome: patient tolerated procedure well   Post-procedure details: sterile dressing applied and wound care instructions given   Dressing type: petrolatum gauze and bandage    Right Shoulder - Posterior  Skin / nail biopsy Type of biopsy: tangential   Informed consent: discussed and consent obtained   Timeout: patient name, date of birth, surgical site, and procedure verified   Procedure prep:  Patient was prepped and draped in usual sterile fashion Prep type:  Isopropyl alcohol Anesthesia: the lesion was anesthetized in a standard fashion   Anesthetic:  1% lidocaine w/ epinephrine 1-100,000 buffered w/ 8.4% NaHCO3 Instrument used: DermaBlade   Hemostasis achieved with: aluminum chloride   Outcome: patient tolerated procedure well  Post-procedure details: sterile dressing applied and wound care instructions given   Dressing type: petrolatum gauze and bandage      No follow-ups on file.  I, Germaine Pomfret, CMA, am acting as scribe  for [employer], DO.   Documentation: I have reviewed the above documentation for accuracy and completeness, and I agree with the above.  Langston Reusing, DO

## 2023-07-26 ENCOUNTER — Encounter: Payer: Self-pay | Admitting: Dermatology

## 2023-07-26 NOTE — Progress Notes (Signed)
Hi Jetta,  Please call pt and notify that their bx results showed both were abnormal moles that requires a shave excision in office with Dr Onalee Hua  Please schedule a surgery in a 12:30pm slot we can do both lesions at that visit.

## 2023-07-30 ENCOUNTER — Other Ambulatory Visit (HOSPITAL_BASED_OUTPATIENT_CLINIC_OR_DEPARTMENT_OTHER): Payer: Self-pay

## 2023-08-03 DIAGNOSIS — M23262 Derangement of other lateral meniscus due to old tear or injury, left knee: Secondary | ICD-10-CM | POA: Diagnosis not present

## 2023-08-03 DIAGNOSIS — M25662 Stiffness of left knee, not elsewhere classified: Secondary | ICD-10-CM | POA: Diagnosis not present

## 2023-08-03 DIAGNOSIS — R531 Weakness: Secondary | ICD-10-CM | POA: Diagnosis not present

## 2023-08-03 NOTE — Patient Instructions (Signed)
SURGICAL WAITING ROOM VISITATION Patients having surgery or a procedure may have no more than 2 support people in the waiting area - these visitors may rotate.    Children under the age of 55 must have an adult with them who is not the patient.  If the patient needs to stay at the hospital during part of their recovery, the visitor guidelines for inpatient rooms apply. Pre-op nurse will coordinate an appropriate time for 1 support person to accompany patient in pre-op.  This support person may not rotate.    Please refer to the [facility] website for the visitor guidelines for Inpatients (after your surgery is over and you are in a regular room).       Your procedure is scheduled on: 08-17-23   Report to [facility] Main Entrance    Report to admitting at 5:15 AM   Call this number if you have problems the morning of surgery [phone]   Do not eat food :After Midnight.   After Midnight you may have the following liquids until 4:30 AM DAY OF SURGERY  Water Non-Citrus Juices (without pulp, NO RED-Apple, White grape, White cranberry) Black Coffee (NO MILK/CREAM OR CREAMERS, sugar ok)  Clear Tea (NO MILK/CREAM OR CREAMERS, sugar ok) regular and decaf                             Plain Jell-O (NO RED)                                           Fruit ices (not with fruit pulp, NO RED)                                     Popsicles (NO RED)                                                               Sports drinks like Gatorade (NO RED)                   The day of surgery:  Drink ONE (1) Pre-Surgery Clear Ensure  at 4:15 AM the morning of surgery. Drink in one sitting. Do not sip.  This drink was given to you during your hospital  pre-op appointment visit. Nothing else to drink after completing the Pre-Surgery Clear Ensure.          If you have questions, please contact your surgeon's office.   FOLLOW  ANY ADDITIONAL PRE OP INSTRUCTIONS YOU RECEIVED FROM YOUR SURGEON'S  OFFICE!!!     Oral Hygiene is also important to reduce your risk of infection.                                    Remember - BRUSH YOUR TEETH THE MORNING OF SURGERY WITH YOUR REGULAR TOOTHPASTE   Do NOT smoke after Midnight   Take these medicines the morning of surgery with A SIP OF WATER:   Amlodipine  Okay to  use nasal spray  Stop all vitamins and herbal supplements 7 days before surgery                              You may not have any metal on your body including  jewelry, and body piercing             Do not wear  lotions, powders, cologne, or deodorant              Men may shave face and neck.   Do not bring valuables to the hospital. [facility] IS NOT RESPONSIBLE   FOR VALUABLES.   Contacts, dentures or bridgework may not be worn into surgery.  DO NOT BRING YOUR HOME MEDICATIONS TO THE HOSPITAL. PHARMACY WILL DISPENSE MEDICATIONS LISTED ON YOUR MEDICATION LIST TO YOU DURING YOUR ADMISSION IN THE HOSPITAL!    Patients discharged on the day of surgery will not be allowed to drive home.  Someone NEEDS to stay with you for the first 24 hours after anesthesia.   Special Instructions: Bring a copy of your healthcare power of attorney and living will documents the day of surgery if you haven't scanned them before.              Please read over the following fact sheets you were given: IF YOU HAVE QUESTIONS ABOUT YOUR PRE-OP INSTRUCTIONS PLEASE CALL [phone] G[facility]  If you received a COVID test during your pre-op visit  it is requested that you wear a mask when out in public, stay away from anyone that may not be feeling well and notify your surgeon if you develop symptoms. If you test positive for Covid or have been in contact with anyone that has tested positive in the last 10 days please notify you surgeon.  [place] - Preparing for Surgery Before surgery, you can play an important role.  Because skin is not sterile, your skin needs to be as free of germs as possible.  You  can reduce the number of germs on your skin by washing with CHG (chlorahexidine gluconate) soap before surgery.  CHG is an antiseptic cleaner which kills germs and bonds with the skin to continue killing germs even after washing. Please DO NOT use if you have an allergy to CHG or antibacterial soaps.  If your skin becomes reddened/irritated stop using the CHG and inform your nurse when you arrive at Short Stay. Do not shave (including legs and underarms) for at least 48 hours prior to the first CHG shower.  You may shave your face/neck.  Please follow these instructions carefully:  1.  Shower with CHG Soap the night before surgery and the  morning of surgery.  2.  If you choose to wash your hair, wash your hair first as usual with your normal  shampoo.  3.  After you shampoo, rinse your hair and body thoroughly to remove the shampoo.                             4.  Use CHG as you would any other liquid soap.  You can apply chg directly to the skin and wash.  Gently with a scrungie or clean washcloth.  5.  Apply the CHG Soap to your body ONLY FROM THE NECK DOWN.   Do   not use on face/ open  Wound or open sores. Avoid contact with eyes, ears mouth and   genitals (private parts).                       Wash face,  Genitals (private parts) with your normal soap.             6.  Wash thoroughly, paying special attention to the area where your    surgery  will be performed.  7.  Thoroughly rinse your body with warm water from the neck down.  8.  DO NOT shower/wash with your normal soap after using and rinsing off the CHG Soap.                9.  Pat yourself dry with a clean towel.            10.  Wear clean pajamas.            11.  Place clean sheets on your bed the night of your first shower and do not  sleep with pets. Day of Surgery : Do not apply any lotions/deodorants the morning of surgery.  Please wear clean clothes to the hospital/surgery center.  FAILURE TO FOLLOW  THESE INSTRUCTIONS MAY RESULT IN THE CANCELLATION OF YOUR SURGERY  PATIENT SIGNATURE_________________________________  NURSE SIGNATURE__________________________________  ________________________________________________________________________    Rogelia Mire  An incentive spirometer is a tool that can help keep your lungs clear and active. This tool measures how well you are filling your lungs with each breath. Taking long deep breaths may help reverse or decrease the chance of developing breathing (pulmonary) problems (especially infection) following: A long period of time when you are unable to move or be active. BEFORE THE PROCEDURE  If the spirometer includes an indicator to show your best effort, your nurse or respiratory therapist will set it to a desired goal. If possible, sit up straight or lean slightly forward. Try not to slouch. Hold the incentive spirometer in an upright position. INSTRUCTIONS FOR USE  Sit on the edge of your bed if possible, or sit up as far as you can in bed or on a chair. Hold the incentive spirometer in an upright position. Breathe out normally. Place the mouthpiece in your mouth and seal your lips tightly around it. Breathe in slowly and as deeply as possible, raising the piston or the ball toward the top of the column. Hold your breath for 3-5 seconds or for as long as possible. Allow the piston or ball to fall to the bottom of the column. Remove the mouthpiece from your mouth and breathe out normally. Rest for a few seconds and repeat Steps 1 through 7 at least 10 times every 1-2 hours when you are awake. Take your time and take a few normal breaths between deep breaths. The spirometer may include an indicator to show your best effort. Use the indicator as a goal to work toward during each repetition. After each set of 10 deep breaths, practice coughing to be sure your lungs are clear. If you have an incision (the cut made at the time of  surgery), support your incision when coughing by placing a pillow or rolled up towels firmly against it. Once you are able to get out of bed, walk around indoors and cough well. You may stop using the incentive spirometer when instructed by your caregiver.  RISKS AND COMPLICATIONS Take your time so you do not get dizzy or light-headed. If you are in pain,  you may need to take or ask for pain medication before doing incentive spirometry. It is harder to take a deep breath if you are having pain. AFTER USE Rest and breathe slowly and easily. It can be helpful to keep track of a log of your progress. Your caregiver can provide you with a simple table to help with this. If you are using the spirometer at home, follow these instructions: SEEK MEDICAL CARE IF:  You are having difficultly using the spirometer. You have trouble using the spirometer as often as instructed. Your pain medication is not giving enough relief while using the spirometer. You develop fever of 100.5 F (38.1 C) or higher. SEEK IMMEDIATE MEDICAL CARE IF:  You cough up bloody sputum that had not been present before. You develop fever of 102 F (38.9 C) or greater. You develop worsening pain at or near the incision site. MAKE SURE YOU:  Understand these instructions. Will watch your condition. Will get help right away if you are not doing well or get worse. Document Released: 04/05/2007 Document Revised: 02/15/2012 Document Reviewed: 06/06/2007 [facility] Patient Information 2014 [place], [place].   ________________________________________________________________________

## 2023-08-03 NOTE — Progress Notes (Signed)
COVID Vaccine Completed:  Yes  Date of COVID positive in last 90 days:  PCP - Cheryll Cockayne, MD Cardiologist - Steffanie Dunn, MD  Chest x-ray -  EKG - 11-16-22 Epic Stress Test -  ECHO - 11-06-22 Epic Cardiac Cath -  Pacemaker/ICD device last checked: Spinal Cord Stimulator: Long Term Monitor - 11-06-22 Epic Coronary CT - 10-27-22 Epic  Bowel Prep -   Sleep Study -  CPAP -   Fasting Blood Sugar -  Checks Blood Sugar _____ times a day  Last dose of GLP1 agonist-  N/A GLP1 instructions:  N/A   Last dose of SGLT-2 inhibitors-  N/A SGLT-2 instructions: N/A   Blood Thinner Instructions:  Time Aspirin Instructions: Last Dose:  Activity level:  Can go up a flight of stairs and perform activities of daily living without stopping and without symptoms of chest pain or shortness of breath.  Able to exercise without symptoms  Unable to go up a flight of stairs without symptoms of     Anesthesia review:  Ascending aorta dilation, HTN. tachycardia  Patient denies shortness of breath, fever, cough and chest pain at PAT appointment  Patient verbalized understanding of instructions that were given to them at the PAT appointment. Patient was also instructed that they will need to review over the PAT instructions again at home before surgery.

## 2023-08-06 ENCOUNTER — Encounter (HOSPITAL_COMMUNITY)
Admission: RE | Admit: 2023-08-06 | Discharge: 2023-08-06 | Disposition: A | Discharge status: Home / Self Care | Payer: 59 | Source: Ambulatory Visit | Attending: Anesthesiology | Admitting: Anesthesiology

## 2023-08-06 DIAGNOSIS — I10 Essential (primary) hypertension: Secondary | ICD-10-CM

## 2023-08-11 DIAGNOSIS — R531 Weakness: Secondary | ICD-10-CM | POA: Diagnosis not present

## 2023-08-11 DIAGNOSIS — M23262 Derangement of other lateral meniscus due to old tear or injury, left knee: Secondary | ICD-10-CM | POA: Diagnosis not present

## 2023-08-11 DIAGNOSIS — M25662 Stiffness of left knee, not elsewhere classified: Secondary | ICD-10-CM | POA: Diagnosis not present

## 2023-08-16 DIAGNOSIS — R531 Weakness: Secondary | ICD-10-CM | POA: Diagnosis not present

## 2023-08-16 DIAGNOSIS — M25262 Flail joint, left knee: Secondary | ICD-10-CM | POA: Diagnosis not present

## 2023-08-16 DIAGNOSIS — M25662 Stiffness of left knee, not elsewhere classified: Secondary | ICD-10-CM | POA: Diagnosis not present

## 2023-08-16 DIAGNOSIS — C641 Malignant neoplasm of right kidney, except renal pelvis: Secondary | ICD-10-CM | POA: Diagnosis not present

## 2023-08-17 ENCOUNTER — Ambulatory Visit: Admit: 2023-08-17 | Payer: 59 | Admitting: Orthopaedic Surgery

## 2023-08-17 SURGERY — ARTHROSCOPY KNEE
Anesthesia: Choice | Site: Knee | Laterality: Left

## 2023-08-23 DIAGNOSIS — R531 Weakness: Secondary | ICD-10-CM | POA: Diagnosis not present

## 2023-08-23 DIAGNOSIS — R918 Other nonspecific abnormal finding of lung field: Secondary | ICD-10-CM | POA: Diagnosis not present

## 2023-08-23 DIAGNOSIS — C641 Malignant neoplasm of right kidney, except renal pelvis: Secondary | ICD-10-CM | POA: Diagnosis not present

## 2023-08-23 DIAGNOSIS — M25662 Stiffness of left knee, not elsewhere classified: Secondary | ICD-10-CM | POA: Diagnosis not present

## 2023-08-23 DIAGNOSIS — M23262 Derangement of other lateral meniscus due to old tear or injury, left knee: Secondary | ICD-10-CM | POA: Diagnosis not present

## 2023-08-23 DIAGNOSIS — Z85528 Personal history of other malignant neoplasm of kidney: Secondary | ICD-10-CM | POA: Diagnosis not present

## 2023-08-28 ENCOUNTER — Other Ambulatory Visit: Payer: Self-pay | Admitting: Orthopaedic Surgery

## 2023-08-30 DIAGNOSIS — F5221 Male erectile disorder: Secondary | ICD-10-CM | POA: Diagnosis not present

## 2023-08-30 DIAGNOSIS — C641 Malignant neoplasm of right kidney, except renal pelvis: Secondary | ICD-10-CM | POA: Diagnosis not present

## 2023-08-30 DIAGNOSIS — Z905 Acquired absence of kidney: Secondary | ICD-10-CM | POA: Diagnosis not present

## 2023-09-08 NOTE — Progress Notes (Signed)
COVID Vaccine Completed: yes  Date of COVID positive in last 90 days:  PCP - Cheryll Cockayne, MD Cardiologist - n/a Electrophysiologist- Steffanie Dunn, MD  LOV 11/16/22  Chest x-ray - n/a Coronary CT- 10/27/22 Epic EKG - 11/16/22 Epic Holter monitor-10/21/22-11/08/22 Stress Test -  ECHO -  Cardiac Cath - n/a Pacemaker/ICD device last checked: n/a Spinal Cord Stimulator: n/a  Bowel Prep - no  Sleep Study - n/a CPAP -   Fasting Blood Sugar - n/a Checks Blood Sugar _____ times a day  Last dose of GLP1 agonist-  N/A GLP1 instructions:  N/A   Last dose of SGLT-2 inhibitors-  N/A SGLT-2 instructions: N/A   Blood Thinner Instructions:  n/a Aspirin Instructions: Last Dose:  Activity level: Can go up a flight of stairs and perform activities of daily living without stopping and without symptoms of chest pain or shortness of breath.   Anesthesia review: HTN, tachycardia, aortic loop dilation   Patient denies shortness of breath, fever, cough and chest pain at PAT appointment  Patient verbalized understanding of instructions that were given to them at the PAT appointment. Patient was also instructed that they will need to review over the PAT instructions again at home before surgery.

## 2023-09-08 NOTE — Patient Instructions (Signed)
SURGICAL WAITING ROOM VISITATION  Patients having surgery or a procedure may have no more than 2 support people in the waiting area - these visitors may rotate.    Children under the age of 21 must have an adult with them who is not the patient.  Due to an increase in RSV and influenza rates and associated hospitalizations, children ages 63 and under may not visit patients in [facility] hospitals.  If the patient needs to stay at the hospital during part of their recovery, the visitor guidelines for inpatient rooms apply. Pre-op nurse will coordinate an appropriate time for 1 support person to accompany patient in pre-op.  This support person may not rotate.    Please refer to the [facility] website for the visitor guidelines for Inpatients (after your surgery is over and you are in a regular room).    Your procedure is scheduled on: 09/21/23   Report to [facility] Main Entrance    Report to admitting at 5:15 AM   Call this number if you have problems the morning of surgery [phone]   Do not eat food :After Midnight.   After Midnight you may have the following liquids until 4:30 AM DAY OF SURGERY  Water Non-Citrus Juices (without pulp, NO RED-Apple, White grape, White cranberry) Black Coffee (NO MILK/CREAM OR CREAMERS, sugar ok)  Clear Tea (NO MILK/CREAM OR CREAMERS, sugar ok) regular and decaf                             Plain Jell-O (NO RED)                                           Fruit ices (not with fruit pulp, NO RED)                                     Popsicles (NO RED)                                                               Sports drinks like Gatorade (NO RED)                 The day of surgery:  Drink ONE (1) Pre-Surgery Clear Ensure at 4:30 AM the morning of surgery. Drink in one sitting. Do not sip.  This drink was given to you during your hospital  pre-op appointment visit. Nothing else to drink after completing the  Pre-Surgery Clear  Ensure.          If you have questions, please contact your surgeon's office.   FOLLOW BOWEL PREP AND ANY ADDITIONAL PRE OP INSTRUCTIONS YOU RECEIVED FROM YOUR SURGEON'S OFFICE!!!     Oral Hygiene is also important to reduce your risk of infection.                                    Remember - BRUSH YOUR TEETH THE MORNING OF SURGERY WITH YOUR REGULAR TOOTHPASTE  DENTURES WILL  BE REMOVED PRIOR TO SURGERY PLEASE DO NOT APPLY "Poly grip" OR ADHESIVES!!!   Stop all vitamins and herbal supplements 7 days before surgery.   Take these medicines the morning of surgery with A SIP OF WATER: Tylenol, Amlodipine, Flonase                              You may not have any metal on your body including jewelry, and body piercing             Do not wear lotions, powders, cologne, or deodorant              Men may shave face and neck.   Do not bring valuables to the hospital. [facility] IS NOT             RESPONSIBLE   FOR VALUABLES.   Contacts, glasses, dentures or bridgework may not be worn into surgery.  DO NOT BRING YOUR HOME MEDICATIONS TO THE HOSPITAL. PHARMACY WILL DISPENSE MEDICATIONS LISTED ON YOUR MEDICATION LIST TO YOU DURING YOUR ADMISSION IN THE HOSPITAL!    Patients discharged on the day of surgery will not be allowed to drive home.  Someone NEEDS to stay with you for the first 24 hours after anesthesia.   Special Instructions: Bring a copy of your healthcare power of attorney and living will documents the day of surgery if you haven't scanned them before.              Please read over the following fact sheets you were given: IF YOU HAVE QUESTIONS ABOUT YOUR PRE-OP INSTRUCTIONS PLEASE CALL [name]   If you received a COVID test during your pre-op visit  it is requested that you wear a mask when out in public, stay away from anyone that may not be feeling well and notify your surgeon if you develop symptoms. If you test positive for Covid or have been in contact with  anyone that has tested positive in the last 10 days please notify you surgeon.    [place] - Preparing for Surgery Before surgery, you can play an important role.  Because skin is not sterile, your skin needs to be as free of germs as possible.  You can reduce the number of germs on your skin by washing with CHG (chlorahexidine gluconate) soap before surgery.  CHG is an antiseptic cleaner which kills germs and bonds with the skin to continue killing germs even after washing. Please DO NOT use if you have an allergy to CHG or antibacterial soaps.  If your skin becomes reddened/irritated stop using the CHG and inform your nurse when you arrive at Short Stay. Do not shave (including legs and underarms) for at least 48 hours prior to the first CHG shower.  You may shave your face/neck.  Please follow these instructions carefully:  1.  Shower with CHG Soap the night before surgery and the  morning of surgery.  2.  If you choose to wash your hair, wash your hair first as usual with your normal  shampoo.  3.  After you shampoo, rinse your hair and body thoroughly to remove the shampoo.                             4.  Use CHG as you would any other liquid soap.  You can apply chg directly to the skin and wash.  Gently with a scrungie or clean washcloth.  5.  Apply the CHG Soap to your body ONLY FROM THE NECK DOWN.   Do   not use on face/ open                           Wound or open sores. Avoid contact with eyes, ears mouth and   genitals (private parts).                       Wash face,  Genitals (private parts) with your normal soap.             6.  Wash thoroughly, paying special attention to the area where your    surgery  will be performed.  7.  Thoroughly rinse your body with warm water from the neck down.  8.  DO NOT shower/wash with your normal soap after using and rinsing off the CHG Soap.                9.  Pat yourself dry with a clean towel.            10.  Wear clean pajamas.             11.  Place clean sheets on your bed the night of your first shower and do not  sleep with pets. Day of Surgery : Do not apply any lotions/deodorants the morning of surgery.  Please wear clean clothes to the hospital/surgery center.  FAILURE TO FOLLOW THESE INSTRUCTIONS MAY RESULT IN THE CANCELLATION OF YOUR SURGERY  PATIENT SIGNATURE_________________________________  NURSE SIGNATURE__________________________________  ________________________________________________________________________  Richard [name]  An incentive spirometer is a tool that can help keep your lungs clear and active. This tool measures how well you are filling your lungs with each breath. Taking long deep breaths may help reverse or decrease the chance of developing breathing (pulmonary) problems (especially infection) following: A long period of time when you are unable to move or be active. BEFORE THE PROCEDURE  If the spirometer includes an indicator to show your best effort, your nurse or respiratory therapist will set it to a desired goal. If possible, sit up straight or lean slightly forward. Try not to slouch. Hold the incentive spirometer in an upright position. INSTRUCTIONS FOR USE  Sit on the edge of your bed if possible, or sit up as far as you can in bed or on a chair. Hold the incentive spirometer in an upright position. Breathe out normally. Place the mouthpiece in your mouth and seal your lips tightly around it. Breathe in slowly and as deeply as possible, raising the piston or the ball toward the top of the column. Hold your breath for 3-5 seconds or for as long as possible. Allow the piston or ball to fall to the bottom of the column. Remove the mouthpiece from your mouth and breathe out normally. Rest for a few seconds and repeat Steps 1 through 7 at least 10 times every 1-2 hours when you are awake. Take your time and take a few normal breaths between deep breaths. The spirometer may include an  indicator to show your best effort. Use the indicator as a goal to work toward during each repetition. After each set of 10 deep breaths, practice coughing to be sure your lungs are clear. If you have an incision (the cut made at the time of surgery), support your incision when coughing by placing  a pillow or rolled up towels firmly against it. Once you are able to get out of bed, walk around indoors and cough well. You may stop using the incentive spirometer when instructed by your caregiver.  RISKS AND COMPLICATIONS Take your time so you do not get dizzy or light-headed. If you are in pain, you may need to take or ask for pain medication before doing incentive spirometry. It is harder to take a deep breath if you are having pain. AFTER USE Rest and breathe slowly and easily. It can be helpful to keep track of a log of your progress. Your caregiver can provide you with a simple table to help with this. If you are using the spirometer at home, follow these instructions: SEEK MEDICAL CARE IF:  You are having difficultly using the spirometer. You have trouble using the spirometer as often as instructed. Your pain medication is not giving enough relief while using the spirometer. You develop fever of 100.5 F (38.1 C) or higher. SEEK IMMEDIATE MEDICAL CARE IF:  You cough up bloody sputum that had not been present before. You develop fever of 102 F (38.9 C) or greater. You develop worsening pain at or near the incision site. MAKE SURE YOU:  Understand these instructions. Will watch your condition. Will get help right away if you are not doing well or get worse. Document Released: 04/05/2007 Document Revised: 02/15/2012 Document Reviewed: 06/06/2007 [facility] Patient Information 2014 [place], [place].   ________________________________________________________________________

## 2023-09-09 ENCOUNTER — Other Ambulatory Visit: Payer: Self-pay

## 2023-09-09 ENCOUNTER — Encounter (HOSPITAL_COMMUNITY): Payer: Self-pay

## 2023-09-09 ENCOUNTER — Encounter (HOSPITAL_COMMUNITY)
Admission: RE | Admit: 2023-09-09 | Discharge: 2023-09-09 | Disposition: A | Discharge status: Home / Self Care | Payer: 59 | Source: Ambulatory Visit | Attending: Orthopaedic Surgery | Admitting: Orthopaedic Surgery

## 2023-09-09 VITALS — BP 130/91 | HR 52 | Temp 98.6°F | Resp 16 | Ht 71.0 in | Wt 226.0 lb

## 2023-09-09 DIAGNOSIS — Z01818 Encounter for other preprocedural examination: Secondary | ICD-10-CM | POA: Insufficient documentation

## 2023-09-09 DIAGNOSIS — I10 Essential (primary) hypertension: Secondary | ICD-10-CM | POA: Insufficient documentation

## 2023-09-09 HISTORY — DX: Personal history of urinary calculi: Z87.442

## 2023-09-09 LAB — CBC
HCT: 43.5 % (ref 39.0–52.0)
Hemoglobin: 14.5 g/dL (ref 13.0–17.0)
MCH: 30.3 pg (ref 26.0–34.0)
MCHC: 33.3 g/dL (ref 30.0–36.0)
MCV: 91 fL (ref 80.0–100.0)
Platelets: 190 10*3/uL (ref 150–400)
RBC: 4.78 MIL/uL (ref 4.22–5.81)
RDW: 12.5 % (ref 11.5–15.5)
WBC: 4.5 10*3/uL (ref 4.0–10.5)
nRBC: 0 % (ref 0.0–0.2)

## 2023-09-09 LAB — BASIC METABOLIC PANEL
Anion gap: 9 (ref 5–15)
BUN: 17 mg/dL (ref 6–20)
CO2: 28 mmol/L (ref 22–32)
Calcium: 9.3 mg/dL (ref 8.9–10.3)
Chloride: 100 mmol/L (ref 98–111)
Creatinine, Ser: 1.3 mg/dL — ABNORMAL HIGH (ref 0.61–1.24)
GFR, Estimated: >60 mL/min (ref >60)
Glucose, Bld: 98 mg/dL (ref 70–99)
Potassium: 4.5 mmol/L (ref 3.5–5.1)
Sodium: 137 mmol/L (ref 135–145)

## 2023-09-16 ENCOUNTER — Other Ambulatory Visit (HOSPITAL_BASED_OUTPATIENT_CLINIC_OR_DEPARTMENT_OTHER): Payer: Self-pay

## 2023-09-16 MED ORDER — FLULAVAL 0.5 ML IM SUSY
PREFILLED_SYRINGE | INTRAMUSCULAR | 0 refills | Status: DC
  Filled 2023-09-16: qty 0.5, 1d supply, fill #0

## 2023-09-17 NOTE — H&P (Signed)
Richard [name] is an 37 y.o. male.   Chief Complaint: left knee pain HPI: Richard [name] is here for the first time in a couple of years.  We saw him back in 2022 and he had an MRI scan which showed a small meniscal tear.  I encouraged him to give it some more time.  Since that visit he has been struggling with it despite multiple interventions with Dr. Katrinka Blazing.  He has had several cortisone injections and even tried gel shots.  He has been wearing a brace and uses a topical anti-inflammatory.  He has had a nephrectomy since he was last in so no longer takes any oral anti-inflammatories.  He has also had another MRI scan.  He remains very active but his knee bothers him with a mechanical catch and pain.    Radiographs:  X-rays that were ordered, performed, and interpreted by me today included 4 views of the left knee demonstrate no acute bony abnormalities.  Some early patellofemoral DJD is noted.  MRI:  I reviewed an MRI scan films and report of a study done at the [facility] system on 05/26/23.  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  Past Medical History:  Diagnosis Date   History of kidney stones    as child   HTN (hypertension)    Renal cell carcinoma (HCC)    Stress fracture, right ankle, sequela    Subluxation of extensor carpi ulnaris tendon, right, sequela     Past Surgical History:  Procedure Laterality Date   ROBOT ASSISTED LAPAROSCOPIC NEPHRECTOMY Right 01/30/2022   Procedure: XI ROBOTIC ASSISTED LAPAROSCOPIC NEPHRECTOMY;  Surgeon: Sebastian Ache, MD;  Location: [facility];  Service: Urology;  Laterality: Right;   TONSILLECTOMY AND ADENOIDECTOMY  1996   WISDOM TOOTH EXTRACTION      Family History  Problem Relation Age of Onset   Hyperlipidemia Mother    Hypertension Mother    Hypertension Father    Arthritis-Osteo Father    Hearing loss Father    High blood pressure Father    High Cholesterol Father     Glaucoma Father    Prostate cancer Father 63   Asthma Sister    Glaucoma Sister    Polycystic ovary syndrome Sister    Hearing loss Maternal Grandmother    Heart disease Maternal Grandmother 59   High blood pressure Maternal Grandmother    High Cholesterol Maternal Grandmother    Stroke Maternal Grandmother 80       TIAs   Heart attack Maternal Grandfather 54   High Cholesterol Maternal Grandfather    Cancer Paternal Grandmother        possibly had lung cancer, smoked   Hearing loss Paternal Grandfather    Heart Problems Maternal Uncle        ?left ventricular issues, ICD   High Cholesterol Maternal Uncle    Bladder Cancer Paternal Uncle    Prostate cancer Paternal Uncle    Bladder Cancer Paternal Uncle        dx. late 9s   Pancreatic cancer Maternal Great-grandfather 84   Stomach cancer Other    Pancreatic cancer Other    Social History:  reports that he has never smoked. He has never used smokeless tobacco. He reports current alcohol use. He reports that he does not use drugs.  Allergies:  Allergies  Allergen Reactions   Prednisone Palpitations    Racing heart    No medications prior to  admission.    No results found for this or any previous visit (from the past 48 hour(s)). No results found.  Review of Systems  Musculoskeletal:  Positive for arthralgias.       Left knee  All other systems reviewed and are negative.   There were no vitals taken for this visit. Physical Exam Constitutional:      Appearance: Normal appearance.  HENT:     Head: Normocephalic and atraumatic.     Nose: Nose normal.     Mouth/Throat:     Pharynx: Oropharynx is clear.  Eyes:     Extraocular Movements: Extraocular movements intact.  Pulmonary:     Effort: Pulmonary effort is normal.  Abdominal:     Palpations: Abdomen is soft.  Musculoskeletal:     Cervical back: Normal range of motion.     Comments: Left knee has trace effusion.  He has full range of motion.  There is some  medial and lateral pain with medial patellar facet crepitation and discomfort.  Ligaments feel stable.  McMurray's test does cause pain in lateral direction.  Hip motion is full and straight leg raise is negative.  Skin:    General: Skin is warm and dry.  Neurological:     General: No focal deficit present.     Mental Status: He is alert and oriented to person, place, and time.  Psychiatric:        Mood and Affect: Mood normal.        Behavior: Behavior normal.        Thought Content: Thought content normal.        Judgment: Judgment normal.      Assessment/Plan Assessment:   1.  Left knee torn lateral meniscus and chondromalacia by MRI 2024 2.  No NSAIDs with history of nephrectomy  Plan: At this point I think Richard [name] should opt for a knee arthroscopy.  He has been struggling with this for more than 2 years despite multiple nonsurgical interventions.  He has several mechanical issues inside the knee which we can address with an arthroscopy.  I reviewed risk of anesthesia, infection, DVT and stressed the importance of some postoperative PT to optimize result.    Ginger Organ Vickii Volland, PA-C 09/17/2023, 1:40 PM

## 2023-09-21 ENCOUNTER — Encounter (HOSPITAL_COMMUNITY)
Admission: RE | Disposition: A | Discharge status: Home / Self Care | Payer: Self-pay | Source: Ambulatory Visit | Attending: Orthopaedic Surgery

## 2023-09-21 ENCOUNTER — Ambulatory Visit (HOSPITAL_BASED_OUTPATIENT_CLINIC_OR_DEPARTMENT_OTHER): Payer: 59 | Admitting: Anesthesiology

## 2023-09-21 ENCOUNTER — Ambulatory Visit (HOSPITAL_COMMUNITY): Payer: 59 | Admitting: Physician Assistant

## 2023-09-21 ENCOUNTER — Other Ambulatory Visit (HOSPITAL_BASED_OUTPATIENT_CLINIC_OR_DEPARTMENT_OTHER): Payer: Self-pay

## 2023-09-21 ENCOUNTER — Ambulatory Visit (HOSPITAL_COMMUNITY)
Admission: RE | Admit: 2023-09-21 | Discharge: 2023-09-21 | Disposition: A | Discharge status: Home / Self Care | Payer: 59 | Source: Ambulatory Visit | Attending: Orthopaedic Surgery | Admitting: Orthopaedic Surgery

## 2023-09-21 ENCOUNTER — Encounter (HOSPITAL_COMMUNITY): Payer: Self-pay | Admitting: Orthopaedic Surgery

## 2023-09-21 DIAGNOSIS — S83282A Other tear of lateral meniscus, current injury, left knee, initial encounter: Secondary | ICD-10-CM | POA: Diagnosis not present

## 2023-09-21 DIAGNOSIS — M2242 Chondromalacia patellae, left knee: Secondary | ICD-10-CM | POA: Diagnosis not present

## 2023-09-21 DIAGNOSIS — Z8249 Family history of ischemic heart disease and other diseases of the circulatory system: Secondary | ICD-10-CM | POA: Diagnosis not present

## 2023-09-21 DIAGNOSIS — I10 Essential (primary) hypertension: Secondary | ICD-10-CM | POA: Diagnosis not present

## 2023-09-21 DIAGNOSIS — Z85528 Personal history of other malignant neoplasm of kidney: Secondary | ICD-10-CM | POA: Diagnosis not present

## 2023-09-21 DIAGNOSIS — X58XXXA Exposure to other specified factors, initial encounter: Secondary | ICD-10-CM | POA: Diagnosis not present

## 2023-09-21 DIAGNOSIS — Z905 Acquired absence of kidney: Secondary | ICD-10-CM | POA: Diagnosis not present

## 2023-09-21 HISTORY — PX: KNEE ARTHROSCOPY: SHX127

## 2023-09-21 SURGERY — ARTHROSCOPY, KNEE
Anesthesia: General | Site: Knee | Laterality: Left

## 2023-09-21 MED ORDER — FENTANYL CITRATE PF 50 MCG/ML IJ SOSY: 25.0000 ug | PREFILLED_SYRINGE | INTRAMUSCULAR | Status: DC | PRN

## 2023-09-21 MED ORDER — LACTATED RINGERS IV SOLN: INTRAVENOUS | Status: DC

## 2023-09-21 MED ORDER — BUPIVACAINE-EPINEPHRINE (PF) 0.5% -1:200000 IJ SOLN
INTRAMUSCULAR | Status: AC
  Filled 2023-09-21: qty 30

## 2023-09-21 MED ORDER — MIDAZOLAM HCL 2 MG/2ML IJ SOLN
INTRAMUSCULAR | Status: AC
  Filled 2023-09-21: qty 2

## 2023-09-21 MED ORDER — DROPERIDOL 2.5 MG/ML IJ SOLN: 0.6250 mg | Freq: Once | INTRAMUSCULAR | Status: DC | PRN

## 2023-09-21 MED ORDER — FENTANYL CITRATE (PF) 100 MCG/2ML IJ SOLN
INTRAMUSCULAR | Status: AC
  Filled 2023-09-21: qty 2

## 2023-09-21 MED ORDER — SODIUM CHLORIDE 0.9 % IR SOLN: Status: DC | PRN

## 2023-09-21 MED ORDER — ORAL CARE MOUTH RINSE: 15.0000 mL | Freq: Once | OROMUCOSAL | Status: AC

## 2023-09-21 MED ORDER — ACETAMINOPHEN 325 MG PO TABS: 325.0000 mg | ORAL_TABLET | ORAL | Status: DC | PRN

## 2023-09-21 MED ORDER — EPINEPHRINE PF 1 MG/ML IJ SOLN
INTRAMUSCULAR | Status: AC
  Filled 2023-09-21: qty 1

## 2023-09-21 MED ORDER — PROPOFOL 10 MG/ML IV BOLUS
INTRAVENOUS | Status: AC
  Filled 2023-09-21: qty 20

## 2023-09-21 MED ORDER — ACETAMINOPHEN 10 MG/ML IV SOLN
INTRAVENOUS | Status: AC
  Filled 2023-09-21: qty 100

## 2023-09-21 MED ORDER — PHENYLEPHRINE HCL (PRESSORS) 10 MG/ML IV SOLN
INTRAVENOUS | Status: DC | PRN
  Administered 2023-09-21: 40 ug via INTRAVENOUS

## 2023-09-21 MED ORDER — PROPOFOL 10 MG/ML IV BOLUS
INTRAVENOUS | Status: DC | PRN
  Administered 2023-09-21: 200 mg via INTRAVENOUS

## 2023-09-21 MED ORDER — ONDANSETRON HCL 4 MG/2ML IJ SOLN
INTRAMUSCULAR | Status: AC
  Filled 2023-09-21: qty 2

## 2023-09-21 MED ORDER — ACETAMINOPHEN 10 MG/ML IV SOLN
1000.0000 mg | Freq: Once | INTRAVENOUS | Status: DC | PRN
  Administered 2023-09-21: 1000 mg via INTRAVENOUS

## 2023-09-21 MED ORDER — OXYCODONE HCL 5 MG/5ML PO SOLN: 5.0000 mg | Freq: Once | ORAL | Status: DC | PRN

## 2023-09-21 MED ORDER — TRAMADOL HCL 50 MG PO TABS
50.0000 mg | ORAL_TABLET | Freq: Four times a day (QID) | ORAL | 0 refills | Status: DC | PRN
Start: 2023-09-21 — End: 2024-05-29
  Filled 2023-09-21: qty 10, 2d supply, fill #0

## 2023-09-21 MED ORDER — ACETAMINOPHEN 160 MG/5ML PO SOLN: 325.0000 mg | ORAL | Status: DC | PRN

## 2023-09-21 MED ORDER — BUPIVACAINE-EPINEPHRINE 0.5% -1:200000 IJ SOLN
INTRAMUSCULAR | Status: DC | PRN
  Administered 2023-09-21: 20 mL

## 2023-09-21 MED ORDER — FENTANYL CITRATE (PF) 100 MCG/2ML IJ SOLN
INTRAMUSCULAR | Status: DC | PRN
  Administered 2023-09-21: 100 ug via INTRAVENOUS
  Administered 2023-09-21 (×4): 25 ug via INTRAVENOUS

## 2023-09-21 MED ORDER — CHLORHEXIDINE GLUCONATE 0.12 % MT SOLN
15.0000 mL | Freq: Once | OROMUCOSAL | Status: AC
  Administered 2023-09-21: 15 mL via OROMUCOSAL

## 2023-09-21 MED ORDER — OXYCODONE HCL 5 MG PO TABS: 5.0000 mg | ORAL_TABLET | Freq: Once | ORAL | Status: DC | PRN

## 2023-09-21 MED ORDER — CEFAZOLIN SODIUM-DEXTROSE 2-4 GM/100ML-% IV SOLN
2.0000 g | INTRAVENOUS | Status: AC
  Administered 2023-09-21: 2 g via INTRAVENOUS
  Filled 2023-09-21: qty 100

## 2023-09-21 MED ORDER — ONDANSETRON HCL 4 MG/2ML IJ SOLN
INTRAMUSCULAR | Status: DC | PRN
  Administered 2023-09-21: 4 mg via INTRAVENOUS

## 2023-09-21 MED ORDER — PHENYLEPHRINE 80 MCG/ML (10ML) SYRINGE FOR IV PUSH (FOR BLOOD PRESSURE SUPPORT)
PREFILLED_SYRINGE | INTRAVENOUS | Status: AC
  Filled 2023-09-21: qty 10

## 2023-09-21 MED ORDER — LIDOCAINE HCL (CARDIAC) PF 100 MG/5ML IV SOSY
PREFILLED_SYRINGE | INTRAVENOUS | Status: DC | PRN
  Administered 2023-09-21: 60 mg via INTRAVENOUS

## 2023-09-21 MED ORDER — MIDAZOLAM HCL 5 MG/5ML IJ SOLN
INTRAMUSCULAR | Status: DC | PRN
  Administered 2023-09-21: 2 mg via INTRAVENOUS

## 2023-09-21 SURGICAL SUPPLY — 44 items
BAG COUNTER SPONGE SURGICOUNT (BAG) ×1 IMPLANT
BAG SPNG CNTER NS LX DISP (BAG) ×1
BLADE EXCALIBUR 4.0X13 (MISCELLANEOUS) IMPLANT
BNDG CMPR 6 X 5 YARDS HK CLSR (GAUZE/BANDAGES/DRESSINGS) ×1
BNDG ELASTIC 6INX 5YD STR LF (GAUZE/BANDAGES/DRESSINGS) ×1 IMPLANT
BNDG GAUZE DERMACEA FLUFF 4 (GAUZE/BANDAGES/DRESSINGS) ×1 IMPLANT
BNDG GZE DERMACEA 4 6PLY (GAUZE/BANDAGES/DRESSINGS) ×1
BONE TUNNEL PLUG CANNULATED (MISCELLANEOUS) ×1 IMPLANT
BURR OVAL 8 FLU 4.0X13 (MISCELLANEOUS) ×1 IMPLANT
COVER SURGICAL LIGHT HANDLE (MISCELLANEOUS) IMPLANT
DISSECTOR 3.5MM X 13CM (MISCELLANEOUS) ×1 IMPLANT
DRAPE ARTHROSCOPY W/POUCH 114 (DRAPES) ×1 IMPLANT
DRAPE SHEET LG 3/4 BI-LAMINATE (DRAPES) ×1 IMPLANT
DRAPE U-SHAPE 47X51 STRL (DRAPES) ×1 IMPLANT
DRSG EMULSION OIL 3X3 NADH (GAUZE/BANDAGES/DRESSINGS) ×1 IMPLANT
DURAPREP 26ML APPLICATOR (WOUND CARE) ×2 IMPLANT
GAUZE 4X4 16PLY ~~LOC~~+RFID DBL (SPONGE) ×1 IMPLANT
GAUZE PAD ABD 8X10 STRL (GAUZE/BANDAGES/DRESSINGS) ×1 IMPLANT
GAUZE SPONGE 4X4 12PLY STRL (GAUZE/BANDAGES/DRESSINGS) ×1 IMPLANT
GLOVE BIO SURGEON STRL SZ8 (GLOVE) ×2 IMPLANT
GLOVE BIOGEL PI IND STRL 7.0 (GLOVE) ×1 IMPLANT
GLOVE BIOGEL PI IND STRL 8 (GLOVE) ×2 IMPLANT
GLOVE SURG SYN 7.0 (GLOVE) ×1
GLOVE SURG SYN 7.0 PF PI (GLOVE) ×1 IMPLANT
GOWN SRG XL LVL 4 BRTHBL STRL (GOWNS) ×1 IMPLANT
GOWN STRL NON-REIN XL LVL4 (GOWNS) ×1
GOWN STRL REUS W/ TWL XL LVL3 (GOWN DISPOSABLE) ×2 IMPLANT
GOWN STRL REUS W/TWL XL LVL3 (GOWN DISPOSABLE) ×2
KIT BASIN OR (CUSTOM PROCEDURE TRAY) ×1 IMPLANT
KIT TURNOVER KIT A (KITS) IMPLANT
MANIFOLD NEPTUNE II (INSTRUMENTS) ×1 IMPLANT
NDL HYPO 22X1.5 SAFETY MO (MISCELLANEOUS) ×1 IMPLANT
NDL SPNL 18GX3.5 QUINCKE PK (NEEDLE) IMPLANT
NEEDLE HYPO 22X1.5 SAFETY MO (MISCELLANEOUS) ×1
NEEDLE SPNL 18GX3.5 QUINCKE PK (NEEDLE)
PACK ARTHROSCOPY WL (CUSTOM PROCEDURE TRAY) ×1 IMPLANT
PAD ARMBOARD 7.5X6 YLW CONV (MISCELLANEOUS) ×2 IMPLANT
PENCIL SMOKE EVACUATOR (MISCELLANEOUS) IMPLANT
STRIP CLOSURE SKIN 1/2X4 (GAUZE/BANDAGES/DRESSINGS) IMPLANT
SYR CONTROL 10ML LL (SYRINGE) ×1 IMPLANT
TOWEL OR 17X26 10 PK STRL BLUE (TOWEL DISPOSABLE) ×1 IMPLANT
TUBING ARTHROSCOPY IRRIG 16FT (MISCELLANEOUS) ×1 IMPLANT
WATER STERILE IRR 1000ML POUR (IV SOLUTION) ×1 IMPLANT
WRAP KNEE MAXI GEL POST OP (GAUZE/BANDAGES/DRESSINGS) IMPLANT

## 2023-09-21 NOTE — Anesthesia Postprocedure Evaluation (Signed)
Anesthesia Post Note  Patient: Richard [name]  Procedure(s) Performed: LEFT KNEE ARTHROSCOPY, PARTIAL LATERAL MENISCECTOMY & CHONDROPLASTY (Left: Knee)     Patient location during evaluation: PACU Anesthesia Type: General Level of consciousness: awake and alert Pain management: pain level controlled Vital Signs Assessment: post-procedure vital signs reviewed and stable Respiratory status: spontaneous breathing, nonlabored ventilation, respiratory function stable and patient connected to nasal cannula oxygen Cardiovascular status: blood pressure returned to baseline and stable Postop Assessment: no apparent nausea or vomiting Anesthetic complications: no  No notable events documented.  Last Vitals:  Vitals:   09/21/23 0900 09/21/23 0915  BP: 124/87 (!) 145/92  Pulse: (!) 59 (!) 58  Resp: 15 20  Temp:  36.6 C  SpO2: 100% 100%    Last Pain:  Vitals:   09/21/23 0915  TempSrc:   PainSc: 4                  Shelton Silvas

## 2023-09-21 NOTE — Op Note (Unsigned)
NAME: Richard [name], Richard [name]. MEDICAL RECORD NO: [id number] ACCOUNT NO: [id number] DATE OF BIRTH: [birth year] FACILITY: [facility] LOCATION: [facility] PHYSICIAN: Lubertha Basque. Jerl Santos, MD  Operative Report   DATE OF PROCEDURE: 09/21/2023  PREOPERATIVE DIAGNOSIS:    1.  Left knee torn lateral meniscus. 2.  Left knee chondromalacia patella.  POSTOPERATIVE DIAGNOSIS:    1.  Left knee torn lateral meniscus. 2.  Left knee chondromalacia patella.  PROCEDURE:  1.  Left knee partial lateral meniscectomy. 2.  Left knee abrasion chondroplasty of patellofemoral and lateral.  Anesthesia was general.    INDICATIONS FOR PROCEDURE: The patient is a 37 year old man with a couple of years of left knee pain and swelling.  This is persisted despite aggressive physical therapy, bracing, and multiple injections.  By two MRI scans he has had a small lateral  meniscus tear.  At this point, he is offered an arthroscopy.  Informed operative consent was obtained after discussion of possible complications including reaction to anesthesia and infection.   Under general anesthesia, a left knee arthroscopy was performed.  He had in the suprapatellar pouch no loose bodies and no significant plica.  He did have some grade 3 and grade 4 change through the intertrochlear groove and a chondroplasty along with  abrasion of bleeding bone was done.  The medial compartment was benign with no evidence of meniscal or articular cartilage injury and the ACL was intact.  In the lateral compartment, he did have a degenerative tear of the middle portion of the lateral  meniscus and a small posterior horn tear addressed with about a 5% partial lateral meniscectomy.  He also had an area of wear on the lateral femoral condyle about the size of a dime directly over this torn cartilage and a chondroplasty was done there.   He was scheduled to go home same day.    DESCRIPTION OF PROCEDURE: The patient was taken to the operating suite where general  anesthetic was applied without difficulty.  He was positioned supine and prepped and draped in normal sterile fashion.  After the administration of preoperative IV  Kefzol and appropriate time-out an arthroscopy of the left knee was performed through a total of 2 portals.  Findings were as noted above and procedure consisted of the partial lateral meniscectomy done with basket and shaver back to stable tissues.  We  also performed the abrasion chondroplasty as described above.  The knee was thoroughly irrigated, followed by placement of Marcaine with epinephrine and morphine.  Adaptic was placed over the portals followed by dry gauze and a loose Ace wrap.  Estimated  blood loss and intraoperative fluids obtained from anesthesia records.    DISPOSITION: The patient was extubated in the operating room and taken to recovery room in stable condition.   Plans were for him to go home same day and follow up in the office in less than week.  I will contact him by phone tonight.     [email or web address] D: 09/21/2023 8:23:48 am T: 09/21/2023 8:33:00 am  JOB: [id number]/ [id number]

## 2023-09-21 NOTE — Anesthesia Procedure Notes (Signed)
Procedure Name: LMA Insertion Date/Time: 09/21/2023 7:47 AM  Performed by: Ahmed Prima, CRNAPre-anesthesia Checklist: Patient identified, Emergency Drugs available, Suction available and Patient being monitored Patient Re-evaluated:Patient Re-evaluated prior to induction Oxygen Delivery Method: Circle System Utilized Preoxygenation: Pre-oxygenation with 100% oxygen Induction Type: IV induction Ventilation: Mask ventilation without difficulty LMA: LMA inserted LMA Size: 5.0 Number of attempts: 1 Placement Confirmation: positive ETCO2 Tube secured with: Tape Dental Injury: Teeth and Oropharynx as per pre-operative assessment

## 2023-09-21 NOTE — Brief Op Note (Signed)
Richard [name] [id number] 09/21/2023   PRE-OP DIAGNOSIS: left knee TLM and CP  POST-OP DIAGNOSIS: same  PROCEDURE: left knee scope  ANESTHESIA: general  Velna Ochs   Dictation #:  [id number]

## 2023-09-21 NOTE — Interval H&P Note (Signed)
History and Physical Interval Note:  09/21/2023 7:35 AM  Richard [name]  has presented today for surgery, with the diagnosis of left knee lateral meniscus tear and chondromalacia.  The various methods of treatment have been discussed with the patient and family. After consideration of risks, benefits and other options for treatment, the patient has consented to  Procedure(s): LEFT KNEE ARTHROSCOPY (Left) as a surgical intervention.  The patient's history has been reviewed, patient examined, no change in status, stable for surgery.  I have reviewed the patient's chart and labs.  Questions were answered to the patient's satisfaction.     Velna Ochs

## 2023-09-21 NOTE — Anesthesia Preprocedure Evaluation (Addendum)
Anesthesia Evaluation  Patient identified by MRN, date of birth, ID band Patient awake    Reviewed: Allergy & Precautions, NPO status , Patient's Chart, lab work & pertinent test results  Airway Mallampati: II  TM Distance: >3 FB Neck ROM: Full    Dental  (+) Teeth Intact, Dental Advisory Given   Pulmonary neg pulmonary ROS   breath sounds clear to auscultation       Cardiovascular hypertension, Pt. on medications  Rhythm:Regular Rate:Normal     Neuro/Psych negative neurological ROS  negative psych ROS   GI/Hepatic negative GI ROS, Neg liver ROS,,,  Endo/Other  negative endocrine ROS    Renal/GU Renal disease     Musculoskeletal  (+) Arthritis ,    Abdominal   Peds  Hematology negative hematology ROS (+)   Anesthesia Other Findings   Reproductive/Obstetrics                             Anesthesia Physical Anesthesia Plan  ASA: 2  Anesthesia Plan: General   Post-op Pain Management: Tylenol PO (pre-op)*   Induction: Intravenous  PONV Risk Score and Plan: 3 and Ondansetron, Dexamethasone and Midazolam  Airway Management Planned: LMA  Additional Equipment: None  Intra-op Plan:   Post-operative Plan: Extubation in OR  Informed Consent: I have reviewed the patients History and Physical, chart, labs and discussed the procedure including the risks, benefits and alternatives for the proposed anesthesia with the patient or authorized representative who has indicated his/her understanding and acceptance.       Plan Discussed with: CRNA  Anesthesia Plan Comments: (No toradol - single kidney)       Anesthesia Quick Evaluation

## 2023-09-21 NOTE — Transfer of Care (Signed)
Immediate Anesthesia Transfer of Care Note  Patient: Richard [name]  Procedure(s) Performed: LEFT KNEE ARTHROSCOPY, PARTIAL LATERAL MENISCECTOMY & CHONDROPLASTY (Left: Knee)  Patient Location: PACU  Anesthesia Type:General  Level of Consciousness: awake, oriented, drowsy, and patient cooperative  Airway & Oxygen Therapy: Patient Spontanous Breathing and Patient connected to face mask oxygen  Post-op Assessment: Report given to RN and Post -op Vital signs reviewed and stable  Post vital signs: Reviewed and stable  Last Vitals:  Vitals Value Taken Time  BP 138/111 09/21/23 0830  Temp    Pulse 94 09/21/23 0830  Resp 12 09/21/23 0830  SpO2 100 % 09/21/23 0830  Vitals shown include unfiled device data.  Last Pain:  Vitals:   09/21/23 0544  TempSrc: Oral  PainSc:       Patients Stated Pain Goal: 3 (09/21/23 0542)  Complications: No notable events documented.

## 2023-09-22 ENCOUNTER — Encounter (HOSPITAL_COMMUNITY): Payer: Self-pay | Admitting: Orthopaedic Surgery

## 2023-09-27 DIAGNOSIS — R531 Weakness: Secondary | ICD-10-CM | POA: Diagnosis not present

## 2023-09-27 DIAGNOSIS — M25662 Stiffness of left knee, not elsewhere classified: Secondary | ICD-10-CM | POA: Diagnosis not present

## 2023-10-06 DIAGNOSIS — M25662 Stiffness of left knee, not elsewhere classified: Secondary | ICD-10-CM | POA: Diagnosis not present

## 2023-10-06 DIAGNOSIS — R531 Weakness: Secondary | ICD-10-CM | POA: Diagnosis not present

## 2023-10-08 ENCOUNTER — Other Ambulatory Visit (HOSPITAL_BASED_OUTPATIENT_CLINIC_OR_DEPARTMENT_OTHER): Payer: Self-pay

## 2023-10-08 MED ORDER — COMIRNATY 30 MCG/0.3ML IM SUSY
PREFILLED_SYRINGE | INTRAMUSCULAR | 0 refills | Status: DC
  Filled 2023-10-08: qty 0.3, 1d supply, fill #0

## 2023-10-11 ENCOUNTER — Ambulatory Visit: Payer: 59 | Admitting: Dermatology

## 2023-10-15 DIAGNOSIS — R531 Weakness: Secondary | ICD-10-CM | POA: Diagnosis not present

## 2023-10-15 DIAGNOSIS — M25662 Stiffness of left knee, not elsewhere classified: Secondary | ICD-10-CM | POA: Diagnosis not present

## 2023-10-21 DIAGNOSIS — M25662 Stiffness of left knee, not elsewhere classified: Secondary | ICD-10-CM | POA: Diagnosis not present

## 2023-10-21 DIAGNOSIS — R531 Weakness: Secondary | ICD-10-CM | POA: Diagnosis not present

## 2023-11-08 ENCOUNTER — Ambulatory Visit: Payer: 59 | Admitting: Dermatology

## 2023-11-08 ENCOUNTER — Encounter: Payer: Self-pay | Admitting: Dermatology

## 2023-11-08 ENCOUNTER — Other Ambulatory Visit (HOSPITAL_BASED_OUTPATIENT_CLINIC_OR_DEPARTMENT_OTHER): Payer: Self-pay

## 2023-11-08 DIAGNOSIS — D235 Other benign neoplasm of skin of trunk: Secondary | ICD-10-CM | POA: Diagnosis not present

## 2023-11-08 DIAGNOSIS — D239 Other benign neoplasm of skin, unspecified: Secondary | ICD-10-CM

## 2023-11-08 DIAGNOSIS — L988 Other specified disorders of the skin and subcutaneous tissue: Secondary | ICD-10-CM | POA: Diagnosis not present

## 2023-11-08 DIAGNOSIS — Z9889 Other specified postprocedural states: Secondary | ICD-10-CM

## 2023-11-08 MED ORDER — MUPIROCIN 2 % EX OINT
1.0000 | TOPICAL_OINTMENT | Freq: Two times a day (BID) | CUTANEOUS | 0 refills | Status: DC
Start: 2023-11-08 — End: 2024-05-29
  Filled 2023-11-08: qty 22, 11d supply, fill #0

## 2023-11-08 MED ORDER — DOXYCYCLINE HYCLATE 100 MG PO TABS
100.0000 mg | ORAL_TABLET | Freq: Two times a day (BID) | ORAL | 0 refills | Status: AC
Start: 2023-11-08 — End: 2023-12-08
  Filled 2023-11-08: qty 10, 5d supply, fill #0

## 2023-11-08 NOTE — Patient Instructions (Signed)

## 2023-11-08 NOTE — Progress Notes (Signed)
Follow-Up Visit   Subjective  Richard [name] is a 37 y.o. male who presents for the following: Excision of DN   The following portions of the chart were reviewed this encounter and updated as appropriate: medications, allergies, medical history  Review of Systems:  No other skin or systemic complaints except as noted in HPI or Assessment and Plan.  Objective  Well appearing patient in no apparent distress; mood and affect are within normal limits.  A focused examination was performed of the following areas: Right upper back paraspinal  Relevant physical exam findings are noted in the Assessment and Plan.     Assessment & Plan   Dysplastic nevus Right Upper back Paraspinal  Skin excision - Right Upper back Paraspinal  Lesion length (cm):  0.8 Margin per side (cm):  0.5 Total excision diameter (cm):  1.8 Informed consent: discussed and consent obtained   Timeout: patient name, date of birth, surgical site, and procedure verified   Procedure prep:  Patient was prepped and draped in usual sterile fashion Prep type:  Isopropyl alcohol and povidone-iodine Anesthesia: the lesion was anesthetized in a standard fashion   Anesthetic:  1% lidocaine w/ epinephrine 1-100,000 buffered w/ 8.4% NaHCO3 Instrument used: #15 blade   Hemostasis achieved with: pressure and electrodesiccation   Outcome: patient tolerated procedure well with no complications   Post-procedure details: sterile dressing applied and wound care instructions given   Dressing type: bandage and pressure dressing (mupirocin)    Skin repair - Right Upper back Paraspinal Complexity:  Complex (Layered closure of subcutaneous facia, deep dermis and epidermis) Final length (cm):  4.5 Informed consent: discussed and consent obtained   Timeout: patient name, date of birth, surgical site, and procedure verified   Procedure prep:  Patient was prepped and draped in usual sterile fashion Prep type:  Povidone-iodine  (Alcohol) Anesthesia: the lesion was anesthetized in a standard fashion   Anesthetic:  1% lidocaine w/ epinephrine 1-100,000 buffered w/ 8.4% NaHCO3 Reason for type of repair: reduce tension to allow closure, reduce the risk of dehiscence, infection, and necrosis, reduce subcutaneous dead space and avoid a hematoma, allow closure of the large defect, preserve normal anatomy, preserve normal anatomical and functional relationships and enhance both functionality and cosmetic results   Undermining: area extensively undermined   Subcutaneous layers (deep stitches):  Suture size:  3-0 Suture type: Vicryl (polyglactin 910)   Stitches:  Buried vertical mattress (Inverted dermal) Fine/surface layer approximation (top stitches):  Suture size:  3-0 Suture type: Vicryl (polyglactin 910)   Suture type comment:  Nylon Stitches: vertical mattress and simple running   Stitches comment:  Nylon Suture removal (days):  14 Hemostasis achieved with: suture and pressure Hemostasis achieved with comment:  Electrocautery Outcome: patient tolerated procedure well with no complications   Post-procedure details: sterile dressing applied and wound care instructions given   Dressing type: pressure dressing and bandage (Mupirocin)    Specimen 1 - Surgical pathology Differential Diagnosis: DN  Check Margins: Yes  Accession #: [id number]  History of nevus excision  Related Medications doxycycline (VIBRA-TABS) 100 MG tablet Take 1 tablet (100 mg total) by mouth 2 (two) times daily.  mupirocin ointment (BACTROBAN) 2 % Apply 1 Application topically 2 (two) times daily.  Return in about 2 weeks (around 11/22/2023) for NV Suture Removal F/U.  Documentation: I have reviewed the above documentation for accuracy and completeness, and I agree with the above.  I, Nell Range, am acting as Neurosurgeon for [employer], DO.  Langston Reusing, DO

## 2023-11-09 LAB — SURGICAL PATHOLOGY

## 2023-11-10 NOTE — Progress Notes (Signed)
Surgical excision pathology report was reviewed and showed clear margins.  No additional treatment required.

## 2023-11-22 ENCOUNTER — Ambulatory Visit: Payer: 59

## 2023-11-22 ENCOUNTER — Encounter: Payer: Self-pay | Admitting: Dermatology

## 2023-11-22 ENCOUNTER — Ambulatory Visit: Payer: 59 | Admitting: Dermatology

## 2023-11-22 VITALS — BP 135/77 | HR 70

## 2023-11-22 DIAGNOSIS — Z48817 Encounter for surgical aftercare following surgery on the skin and subcutaneous tissue: Secondary | ICD-10-CM

## 2023-11-22 DIAGNOSIS — D239 Other benign neoplasm of skin, unspecified: Secondary | ICD-10-CM

## 2023-11-22 DIAGNOSIS — L988 Other specified disorders of the skin and subcutaneous tissue: Secondary | ICD-10-CM | POA: Diagnosis not present

## 2023-11-22 DIAGNOSIS — D2361 Other benign neoplasm of skin of right upper limb, including shoulder: Secondary | ICD-10-CM | POA: Diagnosis not present

## 2023-11-22 NOTE — Progress Notes (Signed)
Follow-Up Visit   Subjective  Richard [name] is a 37 y.o. male who presents for the following: Excision of a dysplastic nevus with moderate to severe atypia on pt's right shoulder, biopsied by Dr. Onalee Hua.   He is healing from a DN severe excision on the mid back. He has sutures to remove.  The following portions of the chart were reviewed this encounter and updated as appropriate: medications, allergies, medical history  Review of Systems:  No other skin or systemic complaints except as noted in HPI or Assessment and Plan.  Objective  Well appearing patient in no apparent distress; mood and affect are within normal limits.  A focused examination was performed of the following areas:  Right shoulder  Relevant physical exam findings are noted in the Assessment and Plan.   Right Shoulder - Posterior Tan-brown and/or pink-flesh-colored symmetric macules and papules.    Assessment & Plan   DYSPLASTIC NEVUS Right Shoulder - Posterior Skin excision - Right Shoulder - Posterior  Excision method:  elliptical Lesion length (cm):  1.1 Lesion width (cm):  1 Margin per side (cm):  0.5 Total excision diameter (cm):  2.1 Informed consent: discussed and consent obtained   Timeout: patient name, date of birth, surgical site, and procedure verified   Procedure prep:  Patient was prepped and draped in usual sterile fashion Prep type:  Chlorhexidine Anesthesia: the lesion was anesthetized in a standard fashion   Anesthetic:  1% lidocaine w/ epinephrine 1-100,000 buffered w/ 8.4% NaHCO3 Instrument used: #15 blade   Hemostasis achieved with: suture, pressure and electrodesiccation   Outcome: patient tolerated procedure well with no complications   Post-procedure details: sterile dressing applied and wound care instructions given   Dressing type: pressure dressing and petrolatum    Skin repair - Right Shoulder - Posterior Complexity:  Complex Final length (cm):  6.5 Informed consent:  discussed and consent obtained   Timeout: patient name, date of birth, surgical site, and procedure verified   Procedure prep:  Patient was prepped and draped in usual sterile fashion Prep type:  Chlorhexidine Anesthesia: the lesion was anesthetized in a standard fashion   Anesthetic:  1% lidocaine w/ epinephrine 1-100,000 buffered w/ 8.4% NaHCO3 Reason for type of repair: reduce tension to allow closure, allow closure of the large defect, preserve normal anatomy and preserve normal anatomical and functional relationships   Undermining: edges undermined   Subcutaneous layers (deep stitches):  Suture size:  4-0 Suture type: Vicryl (polyglactin 910)   Stitches:  Buried horizontal mattress Fine/surface layer approximation (top stitches):  Suture type: cyanoacrylate tissue glue   Suture removal (days):  0 Hemostasis achieved with: suture, pressure and electrodesiccation Outcome: patient tolerated procedure well with no complications   Post-procedure details: sterile dressing applied and wound care instructions given   Dressing type: pressure dressing and petrolatum    The surgical wound was then cleaned, prepped, and re-anesthetized as above. Wound edges were undermined extensively along at least one entire edge and at a distance equal to or greater than the width of the defect (see wound defect size above) in order to achieve closure and decrease wound tension and anatomic distortion. Redundant tissue repair including standing cone removal was performed. Hemostasis was achieved with electrocautery. Subcutaneous and epidermal tissues were approximated with the above sutures. The surgical site was then lightly scrubbed with sterile, saline-soaked gauze. Steri-strips were applied, and the area was then bandaged using Vaseline ointment, non-adherent gauze, gauze pads, and tape to provide an adequate pressure dressing.  The patient tolerated the procedure well, was given detailed written and verbal wound  care instructions, and was discharged in good condition.   The patient will follow-up: PRN.   Return if symptoms worsen or fail to improve.  Dominga Ferry, Surg Tech III, am acting as scribe for Gwenith Daily, MD.   Documentation: I have reviewed the above documentation for accuracy and completeness, and I agree with the above.  Gwenith Daily, MD

## 2023-11-22 NOTE — Patient Instructions (Signed)

## 2023-11-24 LAB — SURGICAL PATHOLOGY

## 2023-12-15 ENCOUNTER — Other Ambulatory Visit (HOSPITAL_BASED_OUTPATIENT_CLINIC_OR_DEPARTMENT_OTHER): Payer: Self-pay

## 2023-12-16 ENCOUNTER — Other Ambulatory Visit (HOSPITAL_BASED_OUTPATIENT_CLINIC_OR_DEPARTMENT_OTHER): Payer: Self-pay

## 2024-03-17 ENCOUNTER — Other Ambulatory Visit (HOSPITAL_BASED_OUTPATIENT_CLINIC_OR_DEPARTMENT_OTHER): Payer: Self-pay

## 2024-03-21 DIAGNOSIS — C641 Malignant neoplasm of right kidney, except renal pelvis: Secondary | ICD-10-CM | POA: Diagnosis not present

## 2024-03-21 DIAGNOSIS — R918 Other nonspecific abnormal finding of lung field: Secondary | ICD-10-CM | POA: Diagnosis not present

## 2024-04-03 DIAGNOSIS — M25562 Pain in left knee: Secondary | ICD-10-CM | POA: Diagnosis not present

## 2024-04-04 DIAGNOSIS — C641 Malignant neoplasm of right kidney, except renal pelvis: Secondary | ICD-10-CM | POA: Diagnosis not present

## 2024-04-04 DIAGNOSIS — Z905 Acquired absence of kidney: Secondary | ICD-10-CM | POA: Diagnosis not present

## 2024-05-29 ENCOUNTER — Encounter: Payer: Self-pay | Admitting: Internal Medicine

## 2024-05-29 NOTE — Patient Instructions (Addendum)
 Blood work was ordered.       Medications changes include :   None    Return in about 1 year (around 05/30/2025) for Physical Exam.    Health Maintenance, Male Adopting a healthy lifestyle and getting preventive care are important in promoting health and wellness. Ask your health care provider about: The right schedule for you to have regular tests and exams. Things you can do on your own to prevent diseases and keep yourself healthy. What should I know about diet, weight, and exercise? Eat a healthy diet  Eat a diet that includes plenty of vegetables, fruits, low-fat dairy products, and lean protein. Do not eat a lot of foods that are high in solid fats, added sugars, or sodium. Maintain a healthy weight Body mass index (BMI) is a measurement that can be used to identify possible weight problems. It estimates body fat based on height and weight. Your health care provider can help determine your BMI and help you achieve or maintain a healthy weight. Get regular exercise Get regular exercise. This is one of the most important things you can do for your health. Most adults should: Exercise for at least 150 minutes each week. The exercise should increase your heart rate and make you sweat (moderate-intensity exercise). Do strengthening exercises at least twice a week. This is in addition to the moderate-intensity exercise. Spend less time sitting. Even light physical activity can be beneficial. Watch cholesterol and blood lipids Have your blood tested for lipids and cholesterol at 39 years of age, then have this test every 5 years. You may need to have your cholesterol levels checked more often if: Your lipid or cholesterol levels are high. You are older than 39 years of age. You are at high risk for heart disease. What should I know about cancer screening? Many types of cancers can be detected early and may often be prevented. Depending on your health history and family  history, you may need to have cancer screening at various ages. This may include screening for: Colorectal cancer. Prostate cancer. Skin cancer. Lung cancer. What should I know about heart disease, diabetes, and high blood pressure? Blood pressure and heart disease High blood pressure causes heart disease and increases the risk of stroke. This is more likely to develop in people who have high blood pressure readings or are overweight. Talk with your health care provider about your target blood pressure readings. Have your blood pressure checked: Every 3-5 years if you are 42-30 years of age. Every year if you are 85 years old or older. If you are between the ages of 66 and 40 and are a current or former smoker, ask your health care provider if you should have a one-time screening for abdominal aortic aneurysm (AAA). Diabetes Have regular diabetes screenings. This checks your fasting blood sugar level. Have the screening done: Once every three years after age 37 if you are at a normal weight and have a low risk for diabetes. More often and at a younger age if you are overweight or have a high risk for diabetes. What should I know about preventing infection? Hepatitis B If you have a higher risk for hepatitis B, you should be screened for this virus. Talk with your health care provider to find out if you are at risk for hepatitis B infection. Hepatitis C Blood testing is recommended for: Everyone born from [birth year] through 1966. Anyone with known risk factors for hepatitis C. Sexually transmitted  infections (STIs) You should be screened each year for STIs, including gonorrhea and chlamydia, if: You are sexually active and are younger than 39 years of age. You are older than 39 years of age and your health care provider tells you that you are at risk for this type of infection. Your sexual activity has changed since you were last screened, and you are at increased risk for chlamydia or  gonorrhea. Ask your health care provider if you are at risk. Ask your health care provider about whether you are at high risk for HIV. Your health care provider may recommend a prescription medicine to help prevent HIV infection. If you choose to take medicine to prevent HIV, you should first get tested for HIV. You should then be tested every 3 months for as long as you are taking the medicine. Follow these instructions at home: Alcohol use Do not drink alcohol if your health care provider tells you not to drink. If you drink alcohol: Limit how much you have to 0-2 drinks a day. Know how much alcohol is in your drink. In the [place] one drink equals one 12 oz bottle of beer (355 mL), one 5 oz glass of wine (148 mL), or one 1 oz glass of hard liquor (44 mL). Lifestyle Do not use any products that contain nicotine or tobacco. These products include cigarettes, chewing tobacco, and vaping devices, such as e-cigarettes. If you need help quitting, ask your health care provider. Do not use street drugs. Do not share needles. Ask your health care provider for help if you need support or information about quitting drugs. General instructions Schedule regular health, dental, and eye exams. Stay current with your vaccines. Tell your health care provider if: You often feel depressed. You have ever been abused or do not feel safe at home. Summary Adopting a healthy lifestyle and getting preventive care are important in promoting health and wellness. Follow your health care provider's instructions about healthy diet, exercising, and getting tested or screened for diseases. Follow your health care provider's instructions on monitoring your cholesterol and blood pressure. This information is not intended to replace advice given to you by your health care provider. Make sure you discuss any questions you have with your health care provider. Document Revised: 04/14/2021 Document Reviewed: 04/14/2021 Elsevier  Patient Education  2024 [employer].

## 2024-05-29 NOTE — Progress Notes (Unsigned)
 Subjective:    Patient ID: Richard [name], male    DOB: [birth year], 38 y.o.   MRN: [id number]     HPI Richard [name] is here for a physical exam and his chronic medical problems.   Working on weight loss. No other concerns   Medications and allergies reviewed with patient and updated if appropriate.  Current Outpatient Medications on File Prior to Visit  Medication Sig Dispense Refill   acetaminophen  (TYLENOL ) 500 MG tablet Take 1,000 mg by mouth every 6 (six) hours as needed for moderate pain.     amLODipine  (NORVASC ) 5 MG tablet Take 1 tablet (5 mg total) by mouth daily. 90 tablet 3   fluticasone  (FLONASE ) 50 MCG/ACT nasal spray Place 1 spray into both nostrils daily. (Patient taking differently: Place 2 sprays into both nostrils daily.) 16 g 0   Multiple Vitamins-Minerals (MULTIVITAMIN WITH MINERALS) tablet Take 1 tablet by mouth daily.     tadalafil  (CIALIS ) 20 MG tablet Take 1 tablet (20 mg total) by mouth daily as needed. (Patient taking differently: Take 10 mg by mouth daily as needed for erectile dysfunction.) 30 tablet 5   No current facility-administered medications on file prior to visit.    Review of Systems  Constitutional:  Negative for fever.  Eyes:  Negative for visual disturbance.  Respiratory:  Negative for cough, shortness of breath and wheezing.   Cardiovascular:  Negative for chest pain, palpitations and leg swelling.  Gastrointestinal:  Negative for abdominal pain, blood in stool, constipation and diarrhea.       Occ gerd  Genitourinary:  Negative for difficulty urinating, dysuria and hematuria.  Musculoskeletal:  Positive for arthralgias (occ left knee pain from meniscus surgery). Negative for back pain.  Skin:  Negative for rash.  Neurological:  Positive for headaches (occ with cialis ). Negative for light-headedness.  Psychiatric/Behavioral:  Negative for dysphoric mood and sleep disturbance. The patient is not nervous/anxious.        Objective:    Vitals:   05/30/24 0751  BP: 120/82  Pulse: 78  Temp: 98.1 F (36.7 C)  SpO2: 97%   Filed Weights   05/30/24 0751  Weight: 232 lb (105.2 kg)   Body mass index is 32.36 kg/m.  BP Readings from Last 3 Encounters:  05/30/24 120/82  11/22/23 135/77  09/21/23 (!) 145/92    Wt Readings from Last 3 Encounters:  05/30/24 232 lb (105.2 kg)  09/21/23 226 lb (102.5 kg)  09/09/23 226 lb (102.5 kg)      Physical Exam Constitutional: He appears well-developed and well-nourished. No distress.  HENT:  Head: Normocephalic and atraumatic.  Right Ear: External ear normal.  Left Ear: External ear normal.  Normal ear canals and TM b/l  Mouth/Throat: Oropharynx is clear and moist. Eyes: Conjunctivae and EOM are normal.  Neck: Neck supple. No tracheal deviation present. No thyromegaly present.  No carotid bruit  Cardiovascular: Normal rate, regular rhythm, normal heart sounds and intact distal pulses.   No murmur heard.  No lower extremity edema. Pulmonary/Chest: Effort normal and breath sounds normal. No respiratory distress. He has no wheezes. He has no rales.  Abdominal: Soft. He exhibits no distension. There is no tenderness.  Genitourinary: deferred  Lymphadenopathy:   He has no cervical adenopathy.  Skin: Skin is warm and dry. He is not diaphoretic.  Psychiatric: He has a normal mood and affect. His behavior is normal.         Assessment & Plan:   Physical exam: Screening  blood work  ordered Exercise   gym - 2 days treadmill - walk, run and 3 days quick cardio/weights Weight  obese - working on weight loss. Diet - doing pretty good.  Eats healthy, eats mostly at home Substance abuse   none   Reviewed recommended immunizations.  Discussed HPV vaccine - he will consider   Health Maintenance  Topic Date Due   Hepatitis B Vaccines (1 of 3 - 19+ 3-dose series) Never done   HPV VACCINES (1 - Risk 3-dose SCDM series) Never done   COVID-19 Vaccine (7 - Pfizer risk  2024-25 season) 04/06/2024   INFLUENZA VACCINE  07/07/2024   DTaP/Tdap/Td (2 - Td or Tdap) 05/20/2030   Hepatitis C Screening  Completed   HIV Screening  Completed   Meningococcal B Vaccine  Aged Out     See Problem List for Assessment and Plan of chronic medical problems.

## 2024-05-30 ENCOUNTER — Ambulatory Visit: Payer: Self-pay | Admitting: Internal Medicine

## 2024-05-30 ENCOUNTER — Ambulatory Visit (INDEPENDENT_AMBULATORY_CARE_PROVIDER_SITE_OTHER): Payer: 59 | Admitting: Internal Medicine

## 2024-05-30 VITALS — BP 120/82 | HR 78 | Temp 98.1°F | Ht 71.0 in | Wt 232.0 lb

## 2024-05-30 DIAGNOSIS — Z0000 Encounter for general adult medical examination without abnormal findings: Secondary | ICD-10-CM | POA: Diagnosis not present

## 2024-05-30 DIAGNOSIS — Z85528 Personal history of other malignant neoplasm of kidney: Secondary | ICD-10-CM

## 2024-05-30 DIAGNOSIS — I10 Essential (primary) hypertension: Secondary | ICD-10-CM | POA: Diagnosis not present

## 2024-05-30 DIAGNOSIS — R739 Hyperglycemia, unspecified: Secondary | ICD-10-CM

## 2024-05-30 DIAGNOSIS — N529 Male erectile dysfunction, unspecified: Secondary | ICD-10-CM | POA: Diagnosis not present

## 2024-05-30 LAB — COMPREHENSIVE METABOLIC PANEL WITH GFR
ALT: 25 U/L (ref 0–53)
AST: 19 U/L (ref 0–37)
Albumin: 4.7 g/dL (ref 3.5–5.2)
Alkaline Phosphatase: 71 U/L (ref 39–117)
BUN: 16 mg/dL (ref 6–23)
CO2: 32 meq/L (ref 19–32)
Calcium: 9.5 mg/dL (ref 8.4–10.5)
Chloride: 101 meq/L (ref 96–112)
Creatinine, Ser: 1.39 mg/dL (ref 0.40–1.50)
GFR: 63.95 mL/min (ref >60.00)
Glucose, Bld: 97 mg/dL (ref 70–99)
Potassium: 4.2 meq/L (ref 3.5–5.1)
Sodium: 138 meq/L (ref 135–145)
Total Bilirubin: 1 mg/dL (ref 0.2–1.2)
Total Protein: 7.2 g/dL (ref 6.0–8.3)

## 2024-05-30 LAB — CBC WITH DIFFERENTIAL/PLATELET
Basophils Absolute: 0 10*3/uL (ref 0.0–0.1)
Basophils Relative: 0.9 % (ref 0.0–3.0)
Eosinophils Absolute: 0.1 10*3/uL (ref 0.0–0.7)
Eosinophils Relative: 1.9 % (ref 0.0–5.0)
HCT: 42.9 % (ref 39.0–52.0)
Hemoglobin: 14.8 g/dL (ref 13.0–17.0)
Lymphocytes Relative: 27 % (ref 12.0–46.0)
Lymphs Abs: 1.2 10*3/uL (ref 0.7–4.0)
MCHC: 34.6 g/dL (ref 30.0–36.0)
MCV: 86.2 fl (ref 78.0–100.0)
Monocytes Absolute: 0.4 10*3/uL (ref 0.1–1.0)
Monocytes Relative: 8 % (ref 3.0–12.0)
Neutro Abs: 2.7 10*3/uL (ref 1.4–7.7)
Neutrophils Relative %: 62.2 % (ref 43.0–77.0)
Platelets: 183 10*3/uL (ref 150.0–400.0)
RBC: 4.97 Mil/uL (ref 4.22–5.81)
RDW: 13.3 % (ref 11.5–15.5)
WBC: 4.4 10*3/uL (ref 4.0–10.5)

## 2024-05-30 LAB — LIPID PANEL
Cholesterol: 196 mg/dL (ref 0–200)
HDL: 47.1 mg/dL (ref >39.00)
LDL Cholesterol: 120 mg/dL — ABNORMAL HIGH (ref 0–99)
NonHDL: 148.58
Total CHOL/HDL Ratio: 4
Triglycerides: 145 mg/dL (ref 0.0–149.0)
VLDL: 29 mg/dL (ref 0.0–40.0)

## 2024-05-30 LAB — TSH: TSH: 1.49 u[IU]/mL (ref 0.35–5.50)

## 2024-05-30 LAB — HEMOGLOBIN A1C: Hgb A1c MFr Bld: 5.2 % (ref 4.6–6.5)

## 2024-05-30 NOTE — Assessment & Plan Note (Signed)
 H/o RCC Following with urology On once a year follow up with imaging

## 2024-05-30 NOTE — Assessment & Plan Note (Addendum)
 Chronic Continue as needed Cialis  10-20 mg daily prn

## 2024-05-30 NOTE — Assessment & Plan Note (Signed)
 Chronic Blood pressure well controlled Advised monitoring BP at home CMP, CBC, lipid, TSH Continue amlodipine  5 mg daily

## 2024-05-30 NOTE — Assessment & Plan Note (Signed)
 Chronic Lab Results  Component Value Date   HGBA1C 5.2 05/26/2022   A1c has been in the normal range Stressed regular exercise, healthy diet, weight loss

## 2024-06-06 ENCOUNTER — Other Ambulatory Visit: Payer: Self-pay | Admitting: Internal Medicine

## 2024-06-07 ENCOUNTER — Other Ambulatory Visit (HOSPITAL_BASED_OUTPATIENT_CLINIC_OR_DEPARTMENT_OTHER): Payer: Self-pay

## 2024-06-07 ENCOUNTER — Other Ambulatory Visit: Payer: Self-pay

## 2024-06-07 MED ORDER — AMLODIPINE BESYLATE 5 MG PO TABS
5.0000 mg | ORAL_TABLET | Freq: Every day | ORAL | 3 refills | Status: AC
  Filled 2024-06-07: qty 90, 90d supply, fill #0
  Filled 2024-09-13: qty 90, 90d supply, fill #1
  Filled 2024-12-15: qty 90, 90d supply, fill #2

## 2024-06-07 MED ORDER — TADALAFIL 20 MG PO TABS
20.0000 mg | ORAL_TABLET | Freq: Every day | ORAL | 5 refills | Status: AC | PRN
Start: 2024-06-07 — End: ?
  Filled 2024-06-07: qty 30, 30d supply, fill #0
  Filled 2024-10-21: qty 6, 6d supply, fill #0

## 2024-07-20 ENCOUNTER — Encounter: Payer: Self-pay | Admitting: Dermatology

## 2024-07-20 ENCOUNTER — Other Ambulatory Visit (HOSPITAL_BASED_OUTPATIENT_CLINIC_OR_DEPARTMENT_OTHER): Payer: Self-pay

## 2024-07-20 ENCOUNTER — Ambulatory Visit: Payer: 59 | Admitting: Dermatology

## 2024-07-20 VITALS — BP 114/81

## 2024-07-20 DIAGNOSIS — W908XXA Exposure to other nonionizing radiation, initial encounter: Secondary | ICD-10-CM | POA: Diagnosis not present

## 2024-07-20 DIAGNOSIS — L814 Other melanin hyperpigmentation: Secondary | ICD-10-CM | POA: Diagnosis not present

## 2024-07-20 DIAGNOSIS — L578 Other skin changes due to chronic exposure to nonionizing radiation: Secondary | ICD-10-CM

## 2024-07-20 DIAGNOSIS — D1801 Hemangioma of skin and subcutaneous tissue: Secondary | ICD-10-CM

## 2024-07-20 DIAGNOSIS — Z872 Personal history of diseases of the skin and subcutaneous tissue: Secondary | ICD-10-CM

## 2024-07-20 DIAGNOSIS — L719 Rosacea, unspecified: Secondary | ICD-10-CM

## 2024-07-20 DIAGNOSIS — D239 Other benign neoplasm of skin, unspecified: Secondary | ICD-10-CM

## 2024-07-20 DIAGNOSIS — Z1283 Encounter for screening for malignant neoplasm of skin: Secondary | ICD-10-CM | POA: Diagnosis not present

## 2024-07-20 DIAGNOSIS — L821 Other seborrheic keratosis: Secondary | ICD-10-CM

## 2024-07-20 DIAGNOSIS — D229 Melanocytic nevi, unspecified: Secondary | ICD-10-CM

## 2024-07-20 DIAGNOSIS — Z808 Family history of malignant neoplasm of other organs or systems: Secondary | ICD-10-CM

## 2024-07-20 DIAGNOSIS — Z86018 Personal history of other benign neoplasm: Secondary | ICD-10-CM | POA: Diagnosis not present

## 2024-07-20 MED ORDER — METRONIDAZOLE 1 % EX GEL
Freq: Two times a day (BID) | CUTANEOUS | 4 refills | Status: AC
Start: 2024-07-20 — End: ?
  Filled 2024-07-20: qty 60, 90d supply, fill #0

## 2024-07-20 NOTE — Progress Notes (Addendum)
 Total Body Skin Exam (TBSE) Visit   Subjective  Richard [name] is a 38 y.o. male ESTABLISHED PATIENT who presents for the following:  Total Body Skin Exam (TBSE)  Patient was last evaluated for TBSE on 11/22/23 .  Patient does not have spots of concern to be evaluated. He does apply sunscreen and/or wears protective coverings. 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 Reports family Hx of skin cancers (father - melanoma).   The patient has spots, moles and lesions to be evaluated, some may be new or changing and the patient has concerns that these could be cancer.  The following portions of the chart were reviewed this encounter and updated as appropriate: medications, allergies, medical history  Review of Systems:  No other skin or systemic complaints except as noted in HPI or Assessment and Plan.  Objective  Well appearing patient in no apparent distress; mood and affect are within normal limits.  A full examination was performed including scalp, head, eyes, ears, nose, lips, neck, chest, axillae, abdomen, back, buttocks, bilateral upper extremities, bilateral lower extremities, hands, feet, fingers, toes, fingernails, and toenails. All findings within normal limits unless otherwise noted below.   Relevant physical exam findings are noted in the Assessment and Plan.    Assessment & Plan   LENTIGINES, SEBORRHEIC KERATOSES, HEMANGIOMAS - Benign normal skin lesions - Benign-appearing - Call for any changes  BENIGN MELANOCYTIC NEVI - Tan-brown and/or pink-flesh-colored symmetric macules and papules - Benign appearing on exam today - Observation - Call clinic for new or changing moles - Recommend daily use of broad spectrum spf 30+ sunscreen to sun-exposed areas.   MILD ACTINIC DAMAGE - Chronic condition, secondary to cumulative UV/sun exposure - diffuse scaly erythematous macules with underlying dyspigmentation - Recommend daily broad spectrum sunscreen SPF 30+ to sun-exposed areas, reapply  every 2 hours as needed.  - Staying in the shade or wearing long sleeves, sun glasses (UVA+UVB protection) and wide brim hats (4-inch brim around the entire circumference of the hat) are also recommended for sun protection.  - Call for new or changing lesions. History of Dysplastic Nevi - No evidence of recurrence today in prior surgery sites - Well healed scar - Recommend regular full body skin exams - Recommend daily broad spectrum sunscreen SPF 30+ to sun-exposed areas, reapply every 2 hours as needed.  - Call if any new or changing lesions are noted between office visits    Rosacea with Facial Breakouts - Assessment: Patient reports facial breakouts with family history of rosacea (mother and sister affected). Clinical examination reveals features consistent with rosacea. Current skincare routine includes moisturizer from [employer] and La Roche-Posay face wash. - Plan:    Prescribe metronidazole  cream, apply twice daily    Recommend Ben tolerance cream as heavy-duty moisturizer    For first week: mix metronidazole  with hydrocortisone, apply thin layer, follow with Ben tolerance cream morning and night    After one week: continue metronidazole  and Ben tolerance cream only, morning and night    Continue current La Roche-Posay face wash    Follow up in 2 weeks if no improvement  Two Atypical Nevi noted on todays exam (pt defer's bx today) - Assessment: Patient has history of two severely abnormal moles removed from back, healing well. One mole appears darker and patchy on examination. Family history of melanoma (father affected) increases skin cancer risk. - Plan:    Perform shave removal of 2 clinically atypical nevi: one on right abdomen, one on left side  Send removed nevi for pathological testing    Schedule procedure within next couple of weeks or after upcoming trip    Educate patient on importance of regular self-examination of moles  Follow-up in 2 weeks if no improvement with  rosacea treatment.  SKIN CANCER SCREENING PERFORMED TODAY.    No follow-ups on file.   Documentation: I have reviewed the above documentation for accuracy and completeness, and I agree with the above.  I, Shirron Maranda, CMA, am acting as scribe for [employer], DO.   Delon Lenis, DO

## 2024-07-20 NOTE — Patient Instructions (Addendum)
 Date: Thu Jul 20 2024  Hello Richard [name],  Thank you for visiting today. Here is a summary of the key instructions:  - Medications:   - Apply metronidazole cream twice a day to affected areas on face   - For the first week:     - Mix metronidazole cream with hydrocortisone cream     - Apply a thin layer of the mixture to affected areas   - After the first week:     - Use only metronidazole cream  - Skin Care:   - Continue using La Roche-Posay face wash   - Use Ben tolerance cream as a moisturizer morning and night   - Apply Ben tolerance cream after medication application  - Follow-up:   - Call the office if rosacea symptoms don't improve in 2 weeks   - Schedule an appointment within the next couple of weeks for mole removal   - Two clinically atypical nevi will be removed by shave biopsy     - One on the right side     - One on the left side  - Other Instructions:   - Pick up metronidazole cream prescription from your pharmacy   - Purchase Ben tolerance cream from [facility] or [facility]   - Use provided coupon for the moisturizer  Please reach out if you have any questions or concerns.  Warm regards,  Dr. Delon Lenis [facility]  Important Information  Due to recent changes in healthcare laws, you may see results of your pathology and/or laboratory studies on MyChart before the doctors have had a chance to review them. We understand that in some cases there may be results that are confusing or concerning to you. Please understand that not all results are received at the same time and often the doctors may need to interpret multiple results in order to provide you with the best plan of care or course of treatment. Therefore, we ask that you please give us  2 business days to thoroughly review all your results before contacting the office for clarification. Should we see a critical lab result, you will be contacted sooner.   If You Need Anything After Your Visit  If you have any  questions or concerns for your doctor, please call our main line at [phone] If no one answers, please leave a voicemail as directed and we will return your call as soon as possible. Messages left after 4 pm will be answered the following business day.   You may also send us  a message via MyChart. We typically respond to MyChart messages within 1-2 business days.  For prescription refills, please ask your pharmacy to contact our office. Our fax number is [phone].  If you have an urgent issue when the clinic is closed that cannot wait until the next business day, you can page your doctor at the number below.    Please note that while we do our best to be available for urgent issues outside of office hours, we are not available 24/7.   If you have an urgent issue and are unable to reach us , you may choose to seek medical care at your doctor's office, retail clinic, urgent care center, or emergency room.  If you have a medical emergency, please immediately call 911 or go to the emergency department. In the event of inclement weather, please call our main line at [phone] for an update on the status of any delays or closures.  Dermatology Medication Tips: Please keep the boxes  that topical medications come in in order to help keep track of the instructions about where and how to use these. Pharmacies typically print the medication instructions only on the boxes and not directly on the medication tubes.   If your medication is too expensive, please contact our office at [phone] or send us  a message through MyChart.   We are unable to tell what your co-pay for medications will be in advance as this is different depending on your insurance coverage. However, we may be able to find a substitute medication at lower cost or fill out paperwork to get insurance to cover a needed medication.   If a prior authorization is required to get your medication covered by your insurance company,  please allow us  1-2 business days to complete this process.  Drug prices often vary depending on where the prescription is filled and some pharmacies may offer cheaper prices.  The website www.goodrx.com contains coupons for medications through different pharmacies. The prices here do not account for what the cost may be with help from insurance (it may be cheaper with your insurance), but the website can give you the price if you did not use any insurance.  - You can print the associated coupon and take it with your prescription to the pharmacy.  - You may also stop by our office during regular business hours and pick up a GoodRx coupon card.  - If you need your prescription sent electronically to a different pharmacy, notify our office through [facility] or by phone at [phone]

## 2024-08-24 NOTE — Progress Notes (Unsigned)
 [name] [address] [place] [address] Phone: [phone] Subjective:   ISusannah [name], am serving as a scribe for Dr. Arthea Claudene.  I'm seeing this patient by the request  of:  [name], MD  CC: Left knee pain  YEP:Dlagzrupcz  Richard [name] is a 38 y.o. male coming in with complaint of R heel pain. Last seen for L knee pain in June 2024. L knee arthroscopy in Oct 2024. Patient states was golfing and the next day was having pain with walking the next day. Pain shoots up the leg posterior side. Not with just standing, but more with walking and going down steps. Didn't take much medications. Did ice every now and again. Much better today      Past Medical History:  Diagnosis Date   Allergy    Seasonal allergies   Arthritis    Left knee   History of kidney stones    as child   HTN (hypertension)    Renal cell carcinoma (HCC)    Stress fracture, right ankle, sequela    Subluxation of extensor carpi ulnaris tendon, right, sequela    Past Surgical History:  Procedure Laterality Date   KNEE ARTHROSCOPY Left 09/21/2023   Procedure: LEFT KNEE ARTHROSCOPY, PARTIAL LATERAL MENISCECTOMY & CHONDROPLASTY;  Surgeon: Sheril Coy, MD;  Location: [facility];  Service: Orthopedics;  Laterality: Left;   ROBOT ASSISTED LAPAROSCOPIC NEPHRECTOMY Right 01/30/2022   Procedure: XI ROBOTIC ASSISTED LAPAROSCOPIC NEPHRECTOMY;  Surgeon: Alvaro Hummer, MD;  Location: [facility];  Service: Urology;  Laterality: Right;   TONSILLECTOMY AND ADENOIDECTOMY  1996   WISDOM TOOTH EXTRACTION     Social History   Socioeconomic History   Marital status: Married    Spouse name: Not on file   Number of children: 0   Years of education: Not on file   Highest education level: Master's degree (e.g., MA, MS, MEng, MEd, MSW, MBA)  Occupational History   Occupation: Marketing  Tobacco Use   Smoking status: Never   Smokeless tobacco: Never  Vaping Use   Vaping  status: Never Used  Substance and Sexual Activity   Alcohol use: Yes    Comment: social on weekend   Drug use: Never   Sexual activity: Yes    Partners: Female  Other Topics Concern   Not on file  Social History Narrative   Not on file   Social Drivers of Health   Financial Resource Strain: Low Risk  (11/12/2021)   Overall Financial Resource Strain (CARDIA)    Difficulty of Paying Living Expenses: Not hard at all  Food Insecurity: No Food Insecurity (11/12/2021)   Hunger Vital Sign    Worried About Running Out of Food in the Last Year: Never true    Ran Out of Food in the Last Year: Never true  Transportation Needs: No Transportation Needs (11/12/2021)   PRAPARE - Administrator, Civil Service (Medical): No    Lack of Transportation (Non-Medical): No  Physical Activity: Sufficiently Active (11/12/2021)   Exercise Vital Sign    Days of Exercise per Week: 5 days    Minutes of Exercise per Session: 30 min  Stress: No Stress Concern Present (11/12/2021)   [employer] of Occupational Health - Occupational Stress Questionnaire    Feeling of Stress : Only a little  Social Connections: Unknown (11/12/2021)   Social Connection and Isolation Panel    Frequency of Communication with Friends and Family: More  than three times a week    Frequency of Social Gatherings with Friends and Family: Twice a week    Attends Religious Services: Patient declined    Database administrator or Organizations: No    Attends Engineer, structural: Not on file    Marital Status: Married   Allergies  Allergen Reactions   Prednisone  Palpitations    Racing heart   Family History  Problem Relation Age of Onset   Hyperlipidemia Mother    Hypertension Mother    Arthritis Mother    Cancer Mother    Stroke Mother    Hypertension Father    Arthritis-Osteo Father    Hearing loss Father    High blood pressure Father    High Cholesterol Father    Glaucoma Father    Prostate cancer  Father 46   Arthritis Father    Asthma Sister    Glaucoma Sister    Polycystic ovary syndrome Sister    Hearing loss Maternal Grandmother    Heart disease Maternal Grandmother    High blood pressure Maternal Grandmother    High Cholesterol Maternal Grandmother    Stroke Maternal Grandmother        TIAs   Hyperlipidemia Maternal Grandmother    Heart attack Maternal Grandfather 54   High Cholesterol Maternal Grandfather    Heart disease Maternal Grandfather    Cancer Paternal Grandmother        possibly had lung cancer, smoked   Hearing loss Paternal Grandfather    Heart Problems Maternal Uncle        ?left ventricular issues, ICD   High Cholesterol Maternal Uncle    Bladder Cancer Paternal Uncle    Prostate cancer Paternal Uncle    Bladder Cancer Paternal Uncle        dx. late 59s   Pancreatic cancer Maternal Great-grandfather 44   Stomach cancer Other    Pancreatic cancer Other    Cancer Paternal Uncle    Cancer Paternal Uncle      Current Outpatient Medications (Cardiovascular):    amLODipine  (NORVASC ) 5 MG tablet, Take 1 tablet (5 mg total) by mouth daily.   tadalafil  (CIALIS ) 20 MG tablet, Take 1 tablet (20 mg total) by mouth daily as needed.  Current Outpatient Medications (Respiratory):    fluticasone  (FLONASE ) 50 MCG/ACT nasal spray, Place 1 spray into both nostrils daily. (Patient taking differently: Place 2 sprays into both nostrils daily.)  Current Outpatient Medications (Analgesics):    acetaminophen  (TYLENOL ) 500 MG tablet, Take 1,000 mg by mouth every 6 (six) hours as needed for moderate pain.   Current Outpatient Medications (Other):    metroNIDAZOLE  (METROGEL ) 1 % gel, Apply topically in the morning and at bedtime.   Multiple Vitamins-Minerals (MULTIVITAMIN WITH MINERALS) tablet, Take 1 tablet by mouth daily.   Reviewed prior external information including notes and imaging from  primary care provider As well as notes that were available from care  everywhere and other healthcare systems.  Past medical history, social, surgical and family history all reviewed in electronic medical record.  No pertanent information unless stated regarding to the chief complaint.   Review of Systems:  No headache, visual changes, nausea, vomiting, diarrhea, constipation, dizziness, abdominal pain, skin rash, fevers, chills, night sweats, weight loss, swollen lymph nodes, body aches, joint swelling, chest pain, shortness of breath, mood changes. POSITIVE muscle aches  Objective  Pulse 84, height 5' 11 (1.803 m), weight 237 lb (107.5 kg), SpO2 98%.   General:  No apparent distress alert and oriented x3 mood and affect normal, dressed appropriately.  HEENT: Pupils equal, extraocular movements intact  Respiratory: Patient's speak in full sentences and does not appear short of breath  Cardiovascular: No lower extremity edema, non tender, no erythema  Left knee exam shows Range of motion.  Stiffness noted minorly of the posterior capsule of the right ankle patient now has 5 out of 5 strength noted.   Limited muscular skeletal ultrasound was performed and interpreted by CLAUDENE HUSSAR, M  Limited ultrasound shows that patient does have very mild hypoechoic changes in the retrocalcaneal space Achilles intact.  No significant bony abnormality of the ankle mortise noted.   Impression and Recommendations:     The above documentation has been reviewed and is accurate and complete Lisandra Mathisen M Abshir Paolini, DO

## 2024-08-25 ENCOUNTER — Other Ambulatory Visit: Payer: Self-pay

## 2024-08-25 ENCOUNTER — Ambulatory Visit: Admitting: Family Medicine

## 2024-08-25 ENCOUNTER — Encounter: Payer: Self-pay | Admitting: Family Medicine

## 2024-08-25 VITALS — BP 114/76 | HR 84 | Ht 71.0 in | Wt 237.0 lb

## 2024-08-25 DIAGNOSIS — M7751 Other enthesopathy of right foot: Secondary | ICD-10-CM | POA: Insufficient documentation

## 2024-08-25 DIAGNOSIS — M79671 Pain in right foot: Secondary | ICD-10-CM | POA: Diagnosis not present

## 2024-08-25 NOTE — Patient Instructions (Signed)
 He lifts in shoes for 2 weeks Ice after activity Do prescribed exercises at least 3x a week See you again in 2 months

## 2024-08-25 NOTE — Assessment & Plan Note (Signed)
 Symptoms as well as ultrasound findings are consistent with a resolving retrocalcaneal bursitis.  Heel lifts given, home exercises given, discussed icing regimen, increasing activity slowly.  Discussed icing regimen.  Follow-up with me again in 6 to 8 weeks otherwise.

## 2024-08-31 ENCOUNTER — Ambulatory Visit: Admitting: Family Medicine

## 2024-09-04 ENCOUNTER — Encounter: Payer: Self-pay | Admitting: Dermatology

## 2024-09-04 ENCOUNTER — Ambulatory Visit (INDEPENDENT_AMBULATORY_CARE_PROVIDER_SITE_OTHER): Admitting: Dermatology

## 2024-09-04 VITALS — BP 141/83 | HR 80

## 2024-09-04 DIAGNOSIS — D225 Melanocytic nevi of trunk: Secondary | ICD-10-CM | POA: Diagnosis not present

## 2024-09-04 DIAGNOSIS — L905 Scar conditions and fibrosis of skin: Secondary | ICD-10-CM

## 2024-09-04 DIAGNOSIS — D485 Neoplasm of uncertain behavior of skin: Secondary | ICD-10-CM

## 2024-09-04 NOTE — Patient Instructions (Addendum)

## 2024-09-04 NOTE — Progress Notes (Signed)
   Follow-Up Visit   Subjective  Richard [name] is a 38 y.o. male who presents for the following: Bx for suspicious Nevi  Patient present today for follow up visit for Bx for suspicious Nevi. Patient was last evaluated on 07/20/24.  Patient denies medication changes.  The following portions of the chart were reviewed this encounter and updated as appropriate: medications, allergies, medical history  Review of Systems:  No other skin or systemic complaints except as noted in HPI or Assessment and Plan.  Objective  Well appearing patient in no apparent distress; mood and affect are within normal limits.  A focused examination was performed of the following areas:   Relevant exam findings are noted in the Assessment and Plan.         Left Abdomen (side) - Upper 9 mm irregular brown macule Right Abdomen (side) - Upper 9 mm irregular brown macule  Assessment & Plan   NEOPLASM OF UNCERTAIN BEHAVIOR OF SKIN (2) Left Abdomen (side) - Upper Epidermal / dermal shaving  Lesion diameter (cm):  0.9 Informed consent: discussed and consent obtained   Timeout: patient name, date of birth, surgical site, and procedure verified   Procedure prep:  Patient was prepped and draped in usual sterile fashion Prep type:  Isopropyl alcohol Anesthesia: the lesion was anesthetized in a standard fashion   Anesthetic:  1% lidocaine  w/ epinephrine  1-100,000 buffered w/ 8.4% NaHCO3 Instrument used: DermaBlade   Hemostasis achieved with: aluminum chloride   Outcome: patient tolerated procedure well   Post-procedure details: sterile dressing applied and wound care instructions given   Dressing type: petrolatum   Additional details:  Pt aware that benign results will be sent to mychart and the staff will call abnormal results will  Specimen A - Surgical pathology Differential Diagnosis: R/O DN  Check Margins: No Right Abdomen (side) - Upper Epidermal / dermal shaving  Lesion diameter (cm):   0.9 Informed consent: discussed and consent obtained   Timeout: patient name, date of birth, surgical site, and procedure verified   Procedure prep:  Patient was prepped and draped in usual sterile fashion Prep type:  Isopropyl alcohol Anesthesia: the lesion was anesthetized in a standard fashion   Anesthetic:  1% lidocaine  w/ epinephrine  1-100,000 buffered w/ 8.4% NaHCO3 Instrument used: DermaBlade   Hemostasis achieved with: aluminum chloride   Outcome: patient tolerated procedure well   Post-procedure details: sterile dressing applied and wound care instructions given   Dressing type: petrolatum   Additional details:  Pt aware that benign results will be sent to mychart and the staff will call abnormal results will  Specimen B - Surgical pathology Differential Diagnosis: R/O DN  Check Margins: No  Return for Keep Scheduled F/U.  I, Jetta Ager, am acting as Neurosurgeon for [employer], DO.  Documentation: I have reviewed the above documentation for accuracy and completeness, and I agree with the above.  Delon Lenis, DO

## 2024-09-06 LAB — SURGICAL PATHOLOGY

## 2024-09-11 ENCOUNTER — Ambulatory Visit: Payer: Self-pay | Admitting: Dermatology

## 2024-09-11 DIAGNOSIS — D239 Other benign neoplasm of skin, unspecified: Secondary | ICD-10-CM

## 2024-09-11 NOTE — Progress Notes (Signed)
 Hi Shirron,   Please call pt and notify that their bx results showed an abnormal mole that requires a full excision in office with Dr Corey     1. Skin, left abdomen (side) - upper :       DYSPLASTIC NEVUS WITH MODERATE TO SEVERE ATYPIA, WITH SCAR, CLOSE TO MARGIN, SEE       DESCRIPTION        2. Skin, right abdomen (side) - upper :       DYSPLASTIC NEVUS WITH MODERATE TO SEVERE ATYPIA, WITH SCAR, PERIPHERAL AND DEEP       MARGINS INVOLVED, SEE DESCRIPTION

## 2024-09-12 ENCOUNTER — Encounter: Payer: Self-pay | Admitting: Dermatology

## 2024-09-12 DIAGNOSIS — D239 Other benign neoplasm of skin, unspecified: Secondary | ICD-10-CM | POA: Insufficient documentation

## 2024-09-22 ENCOUNTER — Other Ambulatory Visit (HOSPITAL_BASED_OUTPATIENT_CLINIC_OR_DEPARTMENT_OTHER): Payer: Self-pay

## 2024-09-22 MED ORDER — FLUZONE 0.5 ML IM SUSY
0.5000 mL | PREFILLED_SYRINGE | Freq: Once | INTRAMUSCULAR | 0 refills | Status: AC
  Filled 2024-09-22: qty 0.5, 1d supply, fill #0

## 2024-10-04 NOTE — Progress Notes (Unsigned)
 [name] [address] [place] [address] Phone: [phone] Subjective:   Richard [name], am serving as a scribe for Dr. Arthea Claudene.  I'm seeing this patient by the request  of:  [name], MD  CC: Right heel pain  YEP:Dlagzrupcz  08/25/2024 Symptoms as well as ultrasound findings are consistent with a resolving retrocalcaneal bursitis.  Heel lifts given, home exercises given, discussed icing regimen, increasing activity slowly.  Discussed icing regimen.  Follow-up with me again in 6 to 8 weeks otherwise.      Update 10/06/2024 Richard [name] is a 38 y.o. male coming in with complaint of R heel pain. Patient states heel is okay. Pain is more in achilles now. More constant discomfort. Left knee discomfort. Management for knee.      Past Medical History:  Diagnosis Date   Allergy    Seasonal allergies   Arthritis    Left knee   History of kidney stones    as child   HTN (hypertension)    Multiple dysplastic nevi 09/12/2024   left abdomen (side) - upper - severe atypia - Ex neededright abdomen (side) - upper - severe atypia - Ex needed    Renal cell carcinoma (HCC)    Stress fracture, right ankle, sequela    Subluxation of extensor carpi ulnaris tendon, right, sequela    Past Surgical History:  Procedure Laterality Date   KNEE ARTHROSCOPY Left 09/21/2023   Procedure: LEFT KNEE ARTHROSCOPY, PARTIAL LATERAL MENISCECTOMY & CHONDROPLASTY;  Surgeon: Sheril Coy, MD;  Location: [facility];  Service: Orthopedics;  Laterality: Left;   ROBOT ASSISTED LAPAROSCOPIC NEPHRECTOMY Right 01/30/2022   Procedure: XI ROBOTIC ASSISTED LAPAROSCOPIC NEPHRECTOMY;  Surgeon: Alvaro Hummer, MD;  Location: [facility];  Service: Urology;  Laterality: Right;   TONSILLECTOMY AND ADENOIDECTOMY  1996   WISDOM TOOTH EXTRACTION     Social History   Socioeconomic History   Marital status: Married    Spouse name: Not on file   Number of children: 0    Years of education: Not on file   Highest education level: Master's degree (e.g., MA, MS, MEng, MEd, MSW, MBA)  Occupational History   Occupation: Marketing  Tobacco Use   Smoking status: Never   Smokeless tobacco: Never  Vaping Use   Vaping status: Never Used  Substance and Sexual Activity   Alcohol use: Yes    Comment: social on weekend   Drug use: Never   Sexual activity: Yes    Partners: Female  Other Topics Concern   Not on file  Social History Narrative   Not on file   Social Drivers of Health   Financial Resource Strain: Low Risk  (11/12/2021)   Overall Financial Resource Strain (CARDIA)    Difficulty of Paying Living Expenses: Not hard at all  Food Insecurity: No Food Insecurity (11/12/2021)   Hunger Vital Sign    Worried About Running Out of Food in the Last Year: Never true    Ran Out of Food in the Last Year: Never true  Transportation Needs: No Transportation Needs (11/12/2021)   PRAPARE - Administrator, Civil Service (Medical): No    Lack of Transportation (Non-Medical): No  Physical Activity: Sufficiently Active (11/12/2021)   Exercise Vital Sign    Days of Exercise per Week: 5 days    Minutes of Exercise per Session: 30 min  Stress: No Stress Concern Present (11/12/2021)   [employer] of Occupational Health -  Occupational Stress Questionnaire    Feeling of Stress : Only a little  Social Connections: Unknown (11/12/2021)   Social Connection and Isolation Panel    Frequency of Communication with Friends and Family: More than three times a week    Frequency of Social Gatherings with Friends and Family: Twice a week    Attends Religious Services: Patient declined    Database Administrator or Organizations: No    Attends Engineer, Structural: Not on file    Marital Status: Married   Allergies  Allergen Reactions   Prednisone  Palpitations    Racing heart   Family History  Problem Relation Age of Onset   Hyperlipidemia Mother     Hypertension Mother    Arthritis Mother    Cancer Mother    Stroke Mother    Hypertension Father    Arthritis-Osteo Father    Hearing loss Father    High blood pressure Father    High Cholesterol Father    Glaucoma Father    Prostate cancer Father 82   Arthritis Father    Asthma Sister    Glaucoma Sister    Polycystic ovary syndrome Sister    Hearing loss Maternal Grandmother    Heart disease Maternal Grandmother    High blood pressure Maternal Grandmother    High Cholesterol Maternal Grandmother    Stroke Maternal Grandmother        TIAs   Hyperlipidemia Maternal Grandmother    Heart attack Maternal Grandfather 54   High Cholesterol Maternal Grandfather    Heart disease Maternal Grandfather    Cancer Paternal Grandmother        possibly had lung cancer, smoked   Hearing loss Paternal Grandfather    Heart Problems Maternal Uncle        ?left ventricular issues, ICD   High Cholesterol Maternal Uncle    Bladder Cancer Paternal Uncle    Prostate cancer Paternal Uncle    Bladder Cancer Paternal Uncle        dx. late 87s   Pancreatic cancer Maternal Great-grandfather 32   Stomach cancer Other    Pancreatic cancer Other    Cancer Paternal Uncle    Cancer Paternal Uncle      Current Outpatient Medications (Cardiovascular):    amLODipine  (NORVASC ) 5 MG tablet, Take 1 tablet (5 mg total) by mouth daily.   tadalafil  (CIALIS ) 20 MG tablet, Take 1 tablet (20 mg total) by mouth daily as needed.  Current Outpatient Medications (Respiratory):    fluticasone  (FLONASE ) 50 MCG/ACT nasal spray, Place 1 spray into both nostrils daily. (Patient taking differently: Place 2 sprays into both nostrils daily.)  Current Outpatient Medications (Analgesics):    acetaminophen  (TYLENOL ) 500 MG tablet, Take 1,000 mg by mouth every 6 (six) hours as needed for moderate pain.   Current Outpatient Medications (Other):    metroNIDAZOLE  (METROGEL ) 1 % gel, Apply topically in the morning and at  bedtime.   Multiple Vitamins-Minerals (MULTIVITAMIN WITH MINERALS) tablet, Take 1 tablet by mouth daily.   Reviewed prior external information including notes and imaging from  primary care provider As well as notes that were available from care everywhere and other healthcare systems.  Past medical history, social, surgical and family history all reviewed in electronic medical record.  No pertanent information unless stated regarding to the chief complaint.   Review of Systems:  No headache, visual changes, nausea, vomiting, diarrhea, constipation, dizziness, abdominal pain, skin rash, fevers, chills, night sweats, weight loss, swollen  lymph nodes, body aches, joint swelling, chest pain, shortness of breath, mood changes. POSITIVE muscle aches  Objective  Blood pressure 116/84, pulse 84, height 5' 11 (1.803 m), weight 231 lb (104.8 kg), SpO2 96%.   General: No apparent distress alert and oriented x3 mood and affect normal, dressed appropriately.  HEENT: Pupils equal, extraocular movements intact  Respiratory: Patient's speak in full sentences and does not appear short of breath  Cardiovascular: No lower extremity edema, non tender, no erythema  Right heel exam shows patient does have some difficulty noted with full dorsiflexion and full plantarflexion.  Limited muscular skeletal ultrasound was performed and interpreted by CLAUDENE HUSSAR, M  Limited ultrasound of the heel does show that there is some hypoechoic changes noted.  Seems to of the bursa but did have hypoechoic changes now going more proximally.  This seems to be a ruptured area of the cyst.  Achilles tendon intact with no significant difficulty.    Impression and Recommendations:     The above documentation has been reviewed and is accurate and complete Braidan Ricciardi M Kala Ambriz, DO

## 2024-10-06 ENCOUNTER — Ambulatory Visit: Admitting: Family Medicine

## 2024-10-06 ENCOUNTER — Other Ambulatory Visit: Payer: Self-pay

## 2024-10-06 ENCOUNTER — Encounter: Payer: Self-pay | Admitting: Family Medicine

## 2024-10-06 ENCOUNTER — Ambulatory Visit (INDEPENDENT_AMBULATORY_CARE_PROVIDER_SITE_OTHER)

## 2024-10-06 VITALS — BP 116/84 | HR 84 | Ht 71.0 in | Wt 231.0 lb

## 2024-10-06 DIAGNOSIS — M25562 Pain in left knee: Secondary | ICD-10-CM

## 2024-10-06 DIAGNOSIS — G8929 Other chronic pain: Secondary | ICD-10-CM

## 2024-10-06 DIAGNOSIS — M7751 Other enthesopathy of right foot: Secondary | ICD-10-CM

## 2024-10-06 DIAGNOSIS — M79671 Pain in right foot: Secondary | ICD-10-CM

## 2024-10-06 DIAGNOSIS — M1712 Unilateral primary osteoarthritis, left knee: Secondary | ICD-10-CM

## 2024-10-06 NOTE — Assessment & Plan Note (Signed)
 Know arthritic changes of the knee.  No significant swelling at this time.  Hold on the other injection but will consider it in the near future.  Could get repeat x-rays to further evaluate.  Could consider formal physical therapy but patient is doing everything else right.  Patient given a or encouraged to wear a brace on a more regular basis with lifting and only the compression sleeve with tennis.

## 2024-10-06 NOTE — Patient Instructions (Addendum)
 Good to see you. Xray today. Wear the brace when going to the gym. Voltaren  topical daily on heel after playing for a week. See me again in 2 to 3 months.

## 2024-10-06 NOTE — Assessment & Plan Note (Addendum)
 Unfortunately patient did have a rupture of the bursa sac noted in the area.  I think that this is potentially contributing to the acute pain recently.  Increase activity slowly.  Discussed which activities to do and which ones to avoid.  Follow-up again in 6 to 12 weeks given a pneumatic heel lift.

## 2024-10-19 ENCOUNTER — Ambulatory Visit: Admitting: Family Medicine

## 2024-10-21 ENCOUNTER — Other Ambulatory Visit (HOSPITAL_BASED_OUTPATIENT_CLINIC_OR_DEPARTMENT_OTHER): Payer: Self-pay

## 2024-11-09 ENCOUNTER — Ambulatory Visit: Admitting: Family Medicine

## 2024-11-29 NOTE — Progress Notes (Signed)
 " [name] [address] [place] [address] Phone: [phone] Subjective:   ISusannah [name], am serving as a scribe for Dr. Arthea Claudene.  I'm seeing this patient by the request  of:  [name], MD  CC: left knee and right heel pain   YEP:Dlagzrupcz  10/06/2024 Know arthritic changes of the knee.  No significant swelling at this time.  Hold on the other injection but will consider it in the near future.  Could get repeat x-rays to further evaluate.  Could consider formal physical therapy but patient is doing everything else right.  Patient given a or encouraged to wear a brace on a more regular basis with lifting and only the compression sleeve with tennis.      Update 12/05/2024 Richard [name] is a 38 y.o. male coming in with complaint of L knee and R heel pain. Patient states knee is doing much better. The cold mad it worse, but still good. Ankle is doing much better as well. Progressing in the right direction. R wrist pain. Injury 9 years ago. Started playing tennis more. Has been taping while playing.      Past Medical History:  Diagnosis Date   Allergy    Seasonal allergies   Arthritis    Left knee   History of kidney stones    as child   HTN (hypertension)    Multiple dysplastic nevi 09/12/2024   left abdomen (side) - upper - severe atypia - Ex neededright abdomen (side) - upper - severe atypia - Ex needed    Renal cell carcinoma (HCC)    Stress fracture, right ankle, sequela    Subluxation of extensor carpi ulnaris tendon, right, sequela    Past Surgical History:  Procedure Laterality Date   KNEE ARTHROSCOPY Left 09/21/2023   Procedure: LEFT KNEE ARTHROSCOPY, PARTIAL LATERAL MENISCECTOMY & CHONDROPLASTY;  Surgeon: Sheril Coy, MD;  Location: [facility];  Service: Orthopedics;  Laterality: Left;   ROBOT ASSISTED LAPAROSCOPIC NEPHRECTOMY Right 01/30/2022   Procedure: XI ROBOTIC ASSISTED LAPAROSCOPIC NEPHRECTOMY;  Surgeon:  Alvaro Hummer, MD;  Location: [facility];  Service: Urology;  Laterality: Right;   TONSILLECTOMY AND ADENOIDECTOMY  1996   WISDOM TOOTH EXTRACTION     Social History   Socioeconomic History   Marital status: Married    Spouse name: Not on file   Number of children: 0   Years of education: Not on file   Highest education level: Master's degree (e.g., MA, MS, MEng, MEd, MSW, MBA)  Occupational History   Occupation: Marketing  Tobacco Use   Smoking status: Never   Smokeless tobacco: Never  Vaping Use   Vaping status: Never Used  Substance and Sexual Activity   Alcohol use: Yes    Comment: social on weekend   Drug use: Never   Sexual activity: Yes    Partners: Female  Other Topics Concern   Not on file  Social History Narrative   Not on file   Social Drivers of Health   Tobacco Use: Low Risk (10/06/2024)   Patient History    Smoking Tobacco Use: Never    Smokeless Tobacco Use: Never    Passive Exposure: Not on file  Financial Resource Strain: Low Risk (11/12/2021)   Overall Financial Resource Strain (CARDIA)    Difficulty of Paying Living Expenses: Not hard at all  Food Insecurity: No Food Insecurity (11/12/2021)   Hunger Vital Sign    Worried About Running Out of Food  in the Last Year: Never true    Ran Out of Food in the Last Year: Never true  Transportation Needs: No Transportation Needs (11/12/2021)   PRAPARE - Administrator, Civil Service (Medical): No    Lack of Transportation (Non-Medical): No  Physical Activity: Sufficiently Active (11/12/2021)   Exercise Vital Sign    Days of Exercise per Week: 5 days    Minutes of Exercise per Session: 30 min  Stress: No Stress Concern Present (11/12/2021)   [employer] of Occupational Health - Occupational Stress Questionnaire    Feeling of Stress : Only a little  Social Connections: Unknown (11/12/2021)   Social Connection and Isolation Panel    Frequency of Communication with Friends and Family: More than  three times a week    Frequency of Social Gatherings with Friends and Family: Twice a week    Attends Religious Services: Patient declined    Active Member of Clubs or Organizations: No    Attends Engineer, Structural: Not on file    Marital Status: Married  Depression (PHQ2-9): Low Risk (05/30/2024)   Depression (PHQ2-9)    PHQ-2 Score: 0  Alcohol Screen: Low Risk (11/12/2021)   Alcohol Screen    Last Alcohol Screening Score (AUDIT): 3  Housing: Low Risk (11/12/2021)   Housing    Last Housing Risk Score: 0  Utilities: Not on file  Health Literacy: Not on file   Allergies[1] Family History  Problem Relation Age of Onset   Hyperlipidemia Mother    Hypertension Mother    Arthritis Mother    Cancer Mother    Stroke Mother    Hypertension Father    Arthritis-Osteo Father    Hearing loss Father    High blood pressure Father    High Cholesterol Father    Glaucoma Father    Prostate cancer Father 79   Arthritis Father    Asthma Sister    Glaucoma Sister    Polycystic ovary syndrome Sister    Hearing loss Maternal Grandmother    Heart disease Maternal Grandmother    High blood pressure Maternal Grandmother    High Cholesterol Maternal Grandmother    Stroke Maternal Grandmother        TIAs   Hyperlipidemia Maternal Grandmother    Heart attack Maternal Grandfather 54   High Cholesterol Maternal Grandfather    Heart disease Maternal Grandfather    Cancer Paternal Grandmother        possibly had lung cancer, smoked   Hearing loss Paternal Grandfather    Heart Problems Maternal Uncle        ?left ventricular issues, ICD   High Cholesterol Maternal Uncle    Bladder Cancer Paternal Uncle    Prostate cancer Paternal Uncle    Bladder Cancer Paternal Uncle        dx. late 75s   Pancreatic cancer Maternal Great-grandfather 46   Stomach cancer Other    Pancreatic cancer Other    Cancer Paternal Uncle    Cancer Paternal Uncle     Current Outpatient Medications  (Cardiovascular):    amLODipine  (NORVASC ) 5 MG tablet, Take 1 tablet (5 mg total) by mouth daily.   tadalafil  (CIALIS ) 20 MG tablet, Take 1 tablet (20 mg total) by mouth daily as needed.  Current Outpatient Medications (Respiratory):    fluticasone  (FLONASE ) 50 MCG/ACT nasal spray, Place 1 spray into both nostrils daily. (Patient taking differently: Place 2 sprays into both nostrils daily.)  Current Outpatient Medications (  Analgesics):    acetaminophen  (TYLENOL ) 500 MG tablet, Take 1,000 mg by mouth every 6 (six) hours as needed for moderate pain.  Current Outpatient Medications (Other):    metroNIDAZOLE  (METROGEL ) 1 % gel, Apply topically in the morning and at bedtime.   Multiple Vitamins-Minerals (MULTIVITAMIN WITH MINERALS) tablet, Take 1 tablet by mouth daily.   Reviewed prior external information including notes and imaging from  primary care provider As well as notes that were available from care everywhere and other healthcare systems.  Past medical history, social, surgical and family history all reviewed in electronic medical record.  No pertanent information unless stated regarding to the chief complaint.   Review of Systems:  No headache, visual changes, nausea, vomiting, diarrhea, constipation, dizziness, abdominal pain, skin rash, fevers, chills, night sweats, weight loss, swollen lymph nodes, body aches, joint swelling, chest pain, shortness of breath, mood changes. POSITIVE muscle aches  Objective  Blood pressure 110/68, pulse 71, height 5' 11 (1.803 m), weight 232 lb (105.2 kg), SpO2 98%.   General: No apparent distress alert and oriented x3 mood and affect normal, dressed appropriately.  HEENT: Pupils equal, extraocular movements intact  Respiratory: Patient's speak in full sentences and does not appear short of breath  Cardiovascular: No lower extremity edema, non tender, no erythema  Knee exam shows patient is nontender  Heel exam shows nontender  Right wrist  exam shows the patient does lack the last 5 degrees of extension of the wrist.  Good grip strength noted.  Negative Tinel's  Limited muscular skeletal ultrasound was performed and interpreted by CLAUDENE HUSSAR, M   Limited ultrasound of patient's right wrist shows patient does have a hypoechoic mass within the TFCC that is consistent with a cyst.  Noncompressible, avascular, questionable ganglion.  Could be synovial as well.   Impression and Recommendations:    The above documentation has been reviewed and is accurate and complete Hussar CHRISTELLA Claudene, DO        [1]  Allergies Allergen Reactions   Prednisone  Palpitations    Racing heart   "

## 2024-12-05 ENCOUNTER — Ambulatory Visit: Admitting: Family Medicine

## 2024-12-05 ENCOUNTER — Encounter: Payer: Self-pay | Admitting: Family Medicine

## 2024-12-05 ENCOUNTER — Other Ambulatory Visit: Payer: Self-pay

## 2024-12-05 VITALS — BP 110/68 | HR 71 | Ht 71.0 in | Wt 232.0 lb

## 2024-12-05 DIAGNOSIS — M71331 Other bursal cyst, right wrist: Secondary | ICD-10-CM | POA: Insufficient documentation

## 2024-12-05 DIAGNOSIS — M25531 Pain in right wrist: Secondary | ICD-10-CM

## 2024-12-05 NOTE — Patient Instructions (Signed)
 Another cyst! Try taping 2 fingers above the bone See you again in 2-3 months

## 2024-12-05 NOTE — Assessment & Plan Note (Signed)
 Appears to be more of a synovial cyst.  I think it is secondary to the injury of the TFCC.  Patient does seems to be doing relatively well.  Discussed a different taping measure.  Increase activity slowly.  Discussed icing regimen.  Follow-up again in 6 to 8 weeks

## 2024-12-28 ENCOUNTER — Encounter: Payer: Self-pay | Admitting: Family Medicine

## 2025-02-08 ENCOUNTER — Ambulatory Visit: Admitting: Family Medicine

## 2025-02-13 ENCOUNTER — Ambulatory Visit: Admitting: Dermatology

## 2025-02-27 ENCOUNTER — Ambulatory Visit: Admitting: Dermatology

## 2025-05-31 ENCOUNTER — Encounter: Admitting: Internal Medicine

## 2025-07-24 ENCOUNTER — Ambulatory Visit: Admitting: Dermatology
